# Patient Record
Sex: Male | Born: 1985 | Race: Black or African American | Hispanic: No | Marital: Single | Smoking: Current every day smoker
Health system: Southern US, Community
[De-identification: ages and names within clinical notes are randomized; demographics above are authoritative.]

## PROBLEM LIST (undated history)

## (undated) HISTORY — PX: OTHER SURGICAL HISTORY: SHX169

## (undated) HISTORY — PX: TYMPANOSTOMY TUBE PLACEMENT: SHX32

---

## 2015-06-22 ENCOUNTER — Emergency Department (HOSPITAL_COMMUNITY): Payer: Worker's Compensation

## 2015-06-22 ENCOUNTER — Encounter (HOSPITAL_COMMUNITY): Payer: Self-pay | Admitting: Emergency Medicine

## 2015-06-22 ENCOUNTER — Emergency Department (HOSPITAL_COMMUNITY)
Admission: EM | Admit: 2015-06-22 | Discharge: 2015-06-22 | Disposition: A | Discharge status: Home / Self Care | Payer: Worker's Compensation | Attending: Emergency Medicine | Admitting: Emergency Medicine

## 2015-06-22 DIAGNOSIS — S6010XA Contusion of unspecified finger with damage to nail, initial encounter: Secondary | ICD-10-CM

## 2015-06-22 DIAGNOSIS — Y929 Unspecified place or not applicable: Secondary | ICD-10-CM | POA: Insufficient documentation

## 2015-06-22 DIAGNOSIS — W208XXA Other cause of strike by thrown, projected or falling object, initial encounter: Secondary | ICD-10-CM | POA: Insufficient documentation

## 2015-06-22 DIAGNOSIS — Y990 Civilian activity done for income or pay: Secondary | ICD-10-CM | POA: Diagnosis not present

## 2015-06-22 DIAGNOSIS — F17210 Nicotine dependence, cigarettes, uncomplicated: Secondary | ICD-10-CM | POA: Insufficient documentation

## 2015-06-22 DIAGNOSIS — Y9389 Activity, other specified: Secondary | ICD-10-CM | POA: Insufficient documentation

## 2015-06-22 DIAGNOSIS — S60151A Contusion of right little finger with damage to nail, initial encounter: Secondary | ICD-10-CM | POA: Insufficient documentation

## 2015-06-22 DIAGNOSIS — S6991XA Unspecified injury of right wrist, hand and finger(s), initial encounter: Secondary | ICD-10-CM | POA: Diagnosis present

## 2015-06-22 MED ORDER — LIDOCAINE HCL 2 % IJ SOLN
10.0000 mL | Freq: Once | INTRAMUSCULAR | Status: AC
  Administered 2015-06-22: 200 mg
  Filled 2015-06-22: qty 20

## 2015-06-22 NOTE — ED Provider Notes (Signed)
CSN: [id number]     Arrival date & time 06/22/15  0051 History   First MD Initiated Contact with Patient 06/22/15 0122     Chief Complaint  Patient presents with  . Finger Injury    Right pinky    (Consider location/radiation/quality/duration/timing/severity/associated sxs/prior Treatment) The history is provided by the patient and medical records. No language interpreter was used.    Jonathan Wong is a 30 y.o. male  with no pertinent PMH who presents to the Emergency Department complaining of right fifth finger injury. 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 Throbbing pain and mild swelling. Ibuprofen taken with little relief. Employer sent him to ER for further evaluation. Denies numbness or tingling. Pain is worse with movement. Tetanus up -o-date.    History reviewed. No pertinent past medical history. History reviewed. No pertinent past surgical history. History reviewed. No pertinent family history. Social History  Substance Use Topics  . Smoking status: Current Every Day Smoker    Types: Cigarettes  . Smokeless tobacco: Never Used  . Alcohol Use: No    Review of Systems  Musculoskeletal: Positive for arthralgias.  Skin: Positive for rash.      Allergies  Review of patient's allergies indicates no known allergies.  Home Medications   Prior to Admission medications   Not on File   BP 135/86 mmHg  Pulse 66  Temp(Src) 98.6 F (37 C) (Oral)  Resp 20  Ht 6' (1.829 m)  Wt 106.595 kg  BMI 31.86 kg/m2  SpO2 100% Physical Exam  Constitutional: He is oriented to person, place, and time. He appears well-developed and well-nourished.  Alert and in no acute distress  HENT:  Head: Normocephalic and atraumatic.  Cardiovascular: Normal rate, regular rhythm and normal heart sounds.   Pulmonary/Chest: Effort normal and breath sounds normal. No respiratory distress. He has no  wheezes. He has no rales.  Musculoskeletal:  Right hand with full ROM. TTP of 5th distal finger. + Subungual hematoma. 2+ radial pulse, sensation intact.  Neurological: He is alert and oriented to person, place, and time.  Skin: Skin is warm and dry.  Nursing note and vitals reviewed.   ED Course  .Nerve Block Date/Time: 06/22/2015 3:45 AM Performed by: Janyth ContesWARD, JAIME PILCHER Authorized by: Janyth ContesWARD, JAIME PILCHER Consent: Verbal consent obtained. Risks and benefits: risks, benefits and alternatives were discussed Consent given by: patient Indications: pain relief Body area: upper extremity Nerve: digital Laterality: right Preparation: Patient was prepped and draped in the usual sterile fashion. Needle gauge: 25 G Location technique: anatomical landmarks Local anesthetic: lidocaine 2% without epinephrine Outcome: pain improved Patient tolerance: Patient tolerated the procedure well with no immediate complications  Wound repair Date/Time: 06/22/2015 3:48 AM Performed by: Janyth ContesWARD, JAIME PILCHER Authorized by: Janyth ContesWARD, JAIME PILCHER Consent: Verbal consent obtained. Consent given by: patient Preparation: Patient was prepped and draped in the usual sterile fashion. Local anesthesia used: yes Anesthesia: nerve block Local anesthetic: lidocaine 2% without epinephrine Anesthetic total: 7 ml Patient tolerance: Patient tolerated the procedure well with no immediate complications Comments: Subungal hematoma drained with cautery.    (including critical care time) Labs Review Labs Reviewed - No data to display  Imaging Review Dg Finger Little Right  06/22/2015  CLINICAL DATA:  30 year old male with injury to the right fifth digit. EXAM: RIGHT LITTLE FINGER 2+V COMPARISON:  None. FINDINGS: There is no acute fracture or  dislocation. The bones are well mineralized. There is mild diffuse soft tissue swelling of the fifth digit. No radiopaque foreign object identified. IMPRESSION: No acute/ traumatic  osseous pathology. Electronically Signed   By: Elgie Collard M.D.   On: 06/22/2015 02:41   I have personally reviewed and evaluated these images and lab results as part of my medical decision-making.   EKG Interpretation None      MDM   Final diagnoses:  Subungual hematoma of digit of hand, initial encounter   Jonathan Wong presents to ED for right 5th finger pain after a heavy item landed on distal finger. Sent by employer. X-rays were obtained which were unremarkable. Subungual hematoma on exam - digital block performed as dictated above then hematoma drained with cautery. Patient tolerated procedure well. Ibuprofen as needed for pain. Home care instructions were given. PCP follow up recommended and all questions answered.     [facility] Ward, PA-C 06/22/15 1610  Shon Baton, MD 06/22/15 [phone]

## 2015-06-22 NOTE — ED Notes (Signed)
Patient complains of injured finger. Patient was at work lifting something and hand slip. Finger hit conveyor belt and cut finger. Patient was sent here by employer.

## 2015-06-22 NOTE — Discharge Instructions (Signed)
Keep area clean and dry. Ibuprofen as needed for pain. Return to ER for any new or worsening symptoms, any additional concerns.

## 2015-10-18 ENCOUNTER — Emergency Department (HOSPITAL_BASED_OUTPATIENT_CLINIC_OR_DEPARTMENT_OTHER)
Admission: EM | Admit: 2015-10-18 | Discharge: 2015-10-18 | Disposition: A | Discharge status: Home / Self Care | Payer: Self-pay | Attending: Emergency Medicine | Admitting: Emergency Medicine

## 2015-10-18 ENCOUNTER — Emergency Department (HOSPITAL_BASED_OUTPATIENT_CLINIC_OR_DEPARTMENT_OTHER): Payer: Self-pay

## 2015-10-18 ENCOUNTER — Encounter (HOSPITAL_BASED_OUTPATIENT_CLINIC_OR_DEPARTMENT_OTHER): Payer: Self-pay

## 2015-10-18 DIAGNOSIS — J069 Acute upper respiratory infection, unspecified: Secondary | ICD-10-CM | POA: Insufficient documentation

## 2015-10-18 DIAGNOSIS — B9789 Other viral agents as the cause of diseases classified elsewhere: Secondary | ICD-10-CM

## 2015-10-18 DIAGNOSIS — F17210 Nicotine dependence, cigarettes, uncomplicated: Secondary | ICD-10-CM | POA: Insufficient documentation

## 2015-10-18 MED ORDER — OXYMETAZOLINE HCL 0.05 % NA SOLN
1.0000 | Freq: Once | NASAL | Status: AC
  Administered 2015-10-18: 1 via NASAL
  Filled 2015-10-18: qty 15

## 2015-10-18 MED ORDER — ALBUTEROL SULFATE HFA 108 (90 BASE) MCG/ACT IN AERS
2.0000 | INHALATION_SPRAY | RESPIRATORY_TRACT | Status: DC | PRN
  Administered 2015-10-18: 2 via RESPIRATORY_TRACT
  Filled 2015-10-18: qty 6.7

## 2015-10-18 NOTE — ED Provider Notes (Signed)
[facility]-EMERGENCY DEPT Provider Note   CSN: [id number] Arrival date & time: 10/18/15  1244     History   Chief Complaint Chief Complaint  Patient presents with  . Cough    HPI Jonathan Wong is a 30 y.o. male.  HPI  Pt presenting with c/o cough, body aches, fever and fatigue.  He states symptoms began 3 days ago.  He has not had any treatment prior to arrival.  Pt states he was sick with similar illness approx 2 weeks ago and seemed to improved, but then his symptoms recurred several days ago. No sore throat except with coughing.  No vomiting or diarrhea.  He has not had a good appetite but has been drinking fluids.  No rash.  No sick contacts.  Did not receive his flu shot this year.  No recent travel.  There are no other associated systemic symptoms, there are no other alleviating or modifying factors.   History reviewed. No pertinent past medical history.  There are no active problems to display for this patient.   History reviewed. No pertinent surgical history.     Home Medications    Prior to Admission medications   Not on File    Family History No family history on file.  Social History Social History  Substance Use Topics  . Smoking status: Current Every Day Smoker    Types: Cigarettes  . Smokeless tobacco: Never Used  . Alcohol use No     Allergies   Review of patient's allergies indicates no known allergies.   Review of Systems Review of Systems  ROS reviewed and all otherwise negative except for mentioned in HPI   Physical Exam Updated Vital Signs BP 134/68 (BP Location: Left Arm)   Pulse 89   Temp 98.1 F (36.7 C) (Oral)   Resp 16   Ht 6' (1.829 m)   Wt 114.8 kg   SpO2 95%   BMI 34.31 kg/m  Vitals reviewed Physical Exam  Physical Examination: General appearance - alert, well appearing, and in no distress Mental status - alert, oriented to person, place, and time Eyes - no conjunctival injection no scleral icterus Mouth - mucous  membranes moist, pharynx normal without lesions Chest - clear to auscultation with the exception of faint expiratory wheezes at bases, no rales or rhonchi, symmetric air entry, normal respiratory effort Heart - normal rate, regular rhythm, normal S1, S2, no murmurs, rubs, clicks or gallops Abdomen - soft, nontender, nondistended, no masses or organomegaly Neurological - alert, oriented, normal speech Extremities - peripheral pulses normal, no pedal edema, no clubbing or cyanosis Skin - normal coloration and turgor, no rashes   ED Treatments / Results  Labs (all labs ordered are listed, but only abnormal results are displayed) Labs Reviewed - No data to display  EKG  EKG Interpretation None       Radiology Dg Chest 2 View  Result Date: 10/18/2015 CLINICAL DATA:  30 year old male with a history of cough EXAM: CHEST  2 VIEW COMPARISON:  None. FINDINGS: The heart size and mediastinal contours are within normal limits. Both lungs are clear. The visualized skeletal structures are unremarkable. IMPRESSION: No radiographic evidence of acute cardiopulmonary disease. Signed, Yvone NeuJaime S. Loreta AveWagner, DO [facility] Electronically Signed   By: Gilmer MorJaime  Wagner D.O.   On: 10/18/2015 13:23    Procedures Procedures (including critical care time)  Medications Ordered in ED Medications  oxymetazoline (AFRIN) 0.05 % nasal spray 1 spray (1 spray Each  Nare Given 10/18/15 1353)     Initial Impression / Assessment and Plan / ED Course  I have reviewed the triage vital signs and the nursing notes.  Pertinent labs & imaging results that were available during my care of the patient were reviewed by me and considered in my medical decision making (see chart for details).  Clinical Course    Pt presenting with cough, subjective fever, body aches- he states he had similar symptoms 2 weeks ago that recurred.  For this reason CXR obtained as well as mild  wheezing on exam- CXR was reassuring.  Pt treated with albuterol and afrin for symptoms.  Suspect viral source of illness.   Patient is overall nontoxic and well hydrated in appearance.  Discharged with strict return precautions.  Pt agreeable with plan.   Final Clinical Impressions(s) / ED Diagnoses   Final diagnoses:  Viral URI with cough    New Prescriptions There are no discharge medications for this patient.    Jerelyn Scott, MD 10/19/15 [phone]

## 2015-10-18 NOTE — ED Triage Notes (Signed)
C/o prod cough, body aches since Sunday-NAD-steady gait

## 2015-10-18 NOTE — ED Notes (Signed)
MD at bedside. 

## 2015-10-18 NOTE — Discharge Instructions (Signed)
Return to the ED with any concerns including difficulty breathing despite using albuterol every 4 hours, not drinking fluids, decreased urine output, vomiting and not able to keep down liquids or medications, decreased level of alertness/lethargy, or any other alarming symptoms   You can use the nasal spray 2 sprays in each nostril twice daily for no more than 3 days

## 2016-05-12 ENCOUNTER — Emergency Department (HOSPITAL_BASED_OUTPATIENT_CLINIC_OR_DEPARTMENT_OTHER)
Admission: EM | Admit: 2016-05-12 | Discharge: 2016-05-12 | Disposition: A | Discharge status: Home / Self Care | Payer: Self-pay | Attending: Emergency Medicine | Admitting: Emergency Medicine

## 2016-05-12 ENCOUNTER — Encounter (HOSPITAL_BASED_OUTPATIENT_CLINIC_OR_DEPARTMENT_OTHER): Payer: Self-pay | Admitting: Emergency Medicine

## 2016-05-12 DIAGNOSIS — F17210 Nicotine dependence, cigarettes, uncomplicated: Secondary | ICD-10-CM | POA: Insufficient documentation

## 2016-05-12 DIAGNOSIS — R109 Unspecified abdominal pain: Secondary | ICD-10-CM | POA: Insufficient documentation

## 2016-05-12 DIAGNOSIS — M62830 Muscle spasm of back: Secondary | ICD-10-CM | POA: Insufficient documentation

## 2016-05-12 DIAGNOSIS — H53149 Visual discomfort, unspecified: Secondary | ICD-10-CM | POA: Insufficient documentation

## 2016-05-12 DIAGNOSIS — R509 Fever, unspecified: Secondary | ICD-10-CM | POA: Insufficient documentation

## 2016-05-12 DIAGNOSIS — M25511 Pain in right shoulder: Secondary | ICD-10-CM

## 2016-05-12 LAB — URINALYSIS, ROUTINE W REFLEX MICROSCOPIC
Glucose, UA: NEGATIVE mg/dL
Hgb urine dipstick: NEGATIVE
Ketones, ur: 15 mg/dL — AB
Leukocytes, UA: NEGATIVE
Nitrite: NEGATIVE
Protein, ur: NEGATIVE mg/dL
Specific Gravity, Urine: 1.027 (ref 1.005–1.030)
pH: 5.5 (ref 5.0–8.0)

## 2016-05-12 MED ORDER — IBUPROFEN 800 MG PO TABS
800.0000 mg | ORAL_TABLET | Freq: Once | ORAL | Status: AC
Start: 2016-05-12 — End: 2016-05-12
  Administered 2016-05-12: 800 mg via ORAL
  Filled 2016-05-12: qty 1

## 2016-05-12 MED ORDER — CYCLOBENZAPRINE HCL 10 MG PO TABS: 10.0000 mg | ORAL_TABLET | Freq: Three times a day (TID) | ORAL | 0 refills | Status: AC | PRN
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
## 2016-05-12 NOTE — ED Notes (Signed)
ED Provider at bedside. 
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
## 2016-05-12 NOTE — ED Notes (Signed)
Pt teaching provided on medications that may cause drowsiness. Pt instructed not to drive or operate heavy machinery while taking the prescribed medication. Pt verbalized understanding.   
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
## 2016-05-12 NOTE — Discharge Instructions (Signed)

## 2017-10-24 ENCOUNTER — Encounter (HOSPITAL_BASED_OUTPATIENT_CLINIC_OR_DEPARTMENT_OTHER): Payer: Self-pay | Admitting: *Deleted

## 2017-10-24 ENCOUNTER — Other Ambulatory Visit: Payer: Self-pay

## 2017-10-24 ENCOUNTER — Emergency Department (HOSPITAL_BASED_OUTPATIENT_CLINIC_OR_DEPARTMENT_OTHER)
Admission: EM | Admit: 2017-10-24 | Discharge: 2017-10-24 | Disposition: A | Discharge status: Home / Self Care | Payer: Self-pay | Attending: Emergency Medicine | Admitting: Emergency Medicine

## 2017-10-24 DIAGNOSIS — R531 Weakness: Secondary | ICD-10-CM | POA: Insufficient documentation

## 2017-10-24 DIAGNOSIS — F17210 Nicotine dependence, cigarettes, uncomplicated: Secondary | ICD-10-CM | POA: Insufficient documentation

## 2017-10-24 DIAGNOSIS — M25531 Pain in right wrist: Secondary | ICD-10-CM | POA: Insufficient documentation

## 2017-10-24 MED ORDER — IBUPROFEN 600 MG PO TABS: 600.0000 mg | ORAL_TABLET | Freq: Four times a day (QID) | ORAL | 0 refills | Status: DC | PRN

## 2017-10-24 MED ORDER — CYCLOBENZAPRINE HCL 10 MG PO TABS: 10.0000 mg | ORAL_TABLET | Freq: Two times a day (BID) | ORAL | 0 refills | Status: DC | PRN

## 2017-10-24 MED ORDER — PREDNISONE 20 MG PO TABS: ORAL_TABLET | ORAL | 0 refills | Status: DC

## 2017-10-24 NOTE — ED Provider Notes (Signed)
[facility] EMERGENCY DEPARTMENT Provider Note   CSN: [id number] Arrival date & time: 10/24/17  1941     History   Chief Complaint Chief Complaint  Patient presents with  . Wrist Pain    HPI Jonathan Wong is a 32 y.o. male.  The history is provided by the patient. No language interpreter was used.  Wrist Pain      32 year old male presenting complaining of right wrist pain.  For the past 3 to 4 days, patient has had pain involving his right wrist and right palm of hand.  He described pain as a sharp throbbing sensation increasing pain when he tries to make a fist or when he moves his fingers apart.  Symptom has been persistent with some weakness.  He felt as if this is nerve pain.  He denies any cyst significant injury.  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  No associated fever or chills, chest pain or arm pain.  No specific treatment tried at home.  History reviewed. No pertinent past medical history.  There are no active problems to display for this patient.   Past Surgical History:  Procedure Laterality Date  . TYMPANOSTOMY TUBE PLACEMENT          Home Medications    Prior to Admission medications   Not on File    Family History History reviewed. No pertinent family history.  Social History Social History   Tobacco Use  . Smoking status: Current Every Day Smoker    Packs/day: 1.00    Types: Cigarettes  . Smokeless tobacco: Never Used  Substance Use Topics  . Alcohol use: No  . Drug use: No     Allergies   Patient has no known allergies.   Review of Systems Review of Systems  Constitutional: Negative for fever.  Musculoskeletal: Positive for myalgias.  Skin: Negative for wound.  Neurological: Negative for numbness.     Physical Exam Updated Vital Signs BP (!) 129/97 (BP Location: Left Arm)   Pulse 77   Temp 98.5 F (36.9 C) (Oral)   Resp 18   Ht 6' (1.829 m)   Wt 127 kg   SpO2 99%   BMI 37.97 kg/m   Physical Exam    Constitutional: He appears well-developed and well-nourished. No distress.  HENT:  Head: Atraumatic.  Eyes: Conjunctivae are normal.  Neck: Neck supple.  Cardiovascular: Intact distal pulses.  Musculoskeletal: He exhibits tenderness (Left wrist and hand: Mild diffuse tenderness throughout the wrist and palms of pain on palpation.  Normal wrist flexion extension supination and pronation.  Normal finger range of motion throughout all fingers.  Decreased grip strength, brisk cap refill ).  Neurological: He is alert.  Skin: No rash noted.  Psychiatric: He has a normal mood and affect.  Nursing note and vitals reviewed.    ED Treatments / Results  Labs (all labs ordered are listed, but only abnormal results are displayed) Labs Reviewed - No data to display  EKG None  Radiology No results found.  Procedures Procedures (including critical care time)  Medications Ordered in ED Medications - No data to display   Initial Impression / Assessment and Plan / ED Course  I have reviewed the triage vital signs and the nursing notes.  Pertinent labs & imaging results that were available during my care of the patient were reviewed by me and considered in my medical decision making (see chart for details).     BP (!) 129/97 (BP Location: Left Arm)  Pulse 77   Temp 98.5 F (36.9 C) (Oral)   Resp 18   Ht 6' (1.829 m)   Wt 127 kg   SpO2 99%   BMI 37.97 kg/m    Final Clinical Impressions(s) / ED Diagnoses   Final diagnoses:  Right wrist pain    ED Discharge Orders         Ordered    predniSONE (DELTASONE) 20 MG tablet     10/24/17 2209    ibuprofen (ADVIL,MOTRIN) 600 MG tablet  Every 6 hours PRN     10/24/17 2209    cyclobenzaprine (FLEXERIL) 10 MG tablet  2 times daily PRN     10/24/17 2209         10:06 PM Patient here with pain to his right wrist and hand and having trouble making a fist having trouble with grip strength.  It is seems to be associate his weakness  is likely pain related.  No specific injury concerning for fracture or dislocation.  He is neurovascularly intact.  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  Suspect carpal tunnel syndrome causing wrist pain.  Wrist brace provided for support, a short course of steroid and anti-inflammatory medication along with muscle relaxant prescribed.  Hand specialist referral given as needed.   Fayrene Helper, PA-C 10/24/17 2209    Tilden Fossa, MD 10/25/17 [phone]

## 2017-10-24 NOTE — ED Triage Notes (Signed)
Pt /o right wrist pain w/o injury x 1 week

## 2018-01-05 ENCOUNTER — Other Ambulatory Visit: Payer: Self-pay

## 2018-01-05 ENCOUNTER — Encounter (HOSPITAL_BASED_OUTPATIENT_CLINIC_OR_DEPARTMENT_OTHER): Payer: Self-pay

## 2018-01-05 ENCOUNTER — Emergency Department (HOSPITAL_BASED_OUTPATIENT_CLINIC_OR_DEPARTMENT_OTHER)
Admission: EM | Admit: 2018-01-05 | Discharge: 2018-01-06 | Disposition: A | Discharge status: Home / Self Care | Payer: Self-pay | Attending: Emergency Medicine | Admitting: Emergency Medicine

## 2018-01-05 DIAGNOSIS — Z113 Encounter for screening for infections with a predominantly sexual mode of transmission: Secondary | ICD-10-CM | POA: Insufficient documentation

## 2018-01-05 DIAGNOSIS — J111 Influenza due to unidentified influenza virus with other respiratory manifestations: Secondary | ICD-10-CM

## 2018-01-05 DIAGNOSIS — R369 Urethral discharge, unspecified: Secondary | ICD-10-CM | POA: Insufficient documentation

## 2018-01-05 DIAGNOSIS — R079 Chest pain, unspecified: Secondary | ICD-10-CM | POA: Insufficient documentation

## 2018-01-05 DIAGNOSIS — F17210 Nicotine dependence, cigarettes, uncomplicated: Secondary | ICD-10-CM | POA: Insufficient documentation

## 2018-01-05 DIAGNOSIS — R69 Illness, unspecified: Secondary | ICD-10-CM

## 2018-01-05 DIAGNOSIS — Z87438 Personal history of other diseases of male genital organs: Secondary | ICD-10-CM

## 2018-01-05 LAB — URINALYSIS, ROUTINE W REFLEX MICROSCOPIC
Bilirubin Urine: NEGATIVE
GLUCOSE, UA: NEGATIVE mg/dL
Hgb urine dipstick: NEGATIVE
Ketones, ur: NEGATIVE mg/dL
LEUKOCYTES UA: NEGATIVE
Nitrite: NEGATIVE
PROTEIN: 30 mg/dL — AB
Specific Gravity, Urine: >1.03 — ABNORMAL HIGH (ref 1.005–1.030)
pH: 6 (ref 5.0–8.0)

## 2018-01-05 LAB — URINALYSIS, MICROSCOPIC (REFLEX): RBC / HPF: NONE SEEN RBC/hpf (ref 0–5)

## 2018-01-05 MED ORDER — LIDOCAINE HCL (PF) 1 % IJ SOLN
INTRAMUSCULAR | Status: AC
  Administered 2018-01-05: 23:00:00
  Filled 2018-01-05: qty 5

## 2018-01-05 MED ORDER — CEFTRIAXONE SODIUM 250 MG IJ SOLR
250.0000 mg | Freq: Once | INTRAMUSCULAR | Status: AC
  Administered 2018-01-05: 250 mg via INTRAMUSCULAR
  Filled 2018-01-05: qty 250

## 2018-01-05 MED ORDER — SODIUM CHLORIDE 0.9 % IV BOLUS
1000.0000 mL | Freq: Once | INTRAVENOUS | Status: AC
  Administered 2018-01-05: 1000 mL via INTRAVENOUS

## 2018-01-05 MED ORDER — AZITHROMYCIN 250 MG PO TABS
1000.0000 mg | ORAL_TABLET | Freq: Once | ORAL | Status: AC
  Administered 2018-01-05: 1000 mg via ORAL
  Filled 2018-01-05: qty 4

## 2018-01-05 MED ORDER — ACETAMINOPHEN 325 MG PO TABS
650.0000 mg | ORAL_TABLET | Freq: Once | ORAL | Status: AC | PRN
  Administered 2018-01-05: 650 mg via ORAL
  Filled 2018-01-05: qty 2

## 2018-01-05 NOTE — ED Notes (Signed)
Pt began having CP, was extremely diaphoretic, and feeling lightheaded. Pt moved to triage room to obtain EKG. Pt's CP is reproducible to palpation. Pt denies ShOB and nausea.

## 2018-01-05 NOTE — ED Provider Notes (Signed)
[facility] EMERGENCY DEPARTMENT Provider Note   CSN: [id number] Arrival date & time: 01/05/18  1945     History   Chief Complaint Chief Complaint  Patient presents with  . Cough  . Penile Discharge    HPI Jonathan Wong is a 32 y.o. male.  The history is provided by the patient.  URI   This is a new problem. The current episode started 6 to 12 hours ago. The problem has been gradually worsening. The maximum temperature recorded prior to his arrival was 102 to 102.9 F. The fever has been present for less than 1 day. Associated symptoms include congestion, headaches, rhinorrhea and cough. Pertinent negatives include no chest pain and no abdominal pain. Associated symptoms comments: Myalgias, fatigue, lightheaded. He has tried nothing for the symptoms. The treatment provided no relief.  Penile Discharge  This is a new problem. Episode onset: 2 weeks ago, 1 episode. The problem occurs rarely (only 1 time 2 weeks ago and no other episodes). The problem has been resolved. Associated symptoms include headaches. Pertinent negatives include no chest pain and no abdominal pain. Associated symptoms comments: No lesions on the penis, testicular pain or swelling.  No dysuria, frequency or urgency and no discharge now.. Nothing aggravates the symptoms. Nothing relieves the symptoms. He has tried nothing for the symptoms. The treatment provided significant relief.    History reviewed. No pertinent past medical history.  There are no active problems to display for this patient.   Past Surgical History:  Procedure Laterality Date  . TYMPANOSTOMY TUBE PLACEMENT          Home Medications    Prior to Admission medications   Medication Sig Start Date End Date Taking? Authorizing Provider  cyclobenzaprine (FLEXERIL) 10 MG tablet Take 1 tablet (10 mg total) by mouth 2 (two) times daily as needed for muscle spasms. 10/24/17   Fayrene Helperran, [place], PA-C  ibuprofen (ADVIL,MOTRIN) 600 MG tablet  Take 1 tablet (600 mg total) by mouth every 6 (six) hours as needed. 10/24/17   Fayrene Helperran, [place], PA-C  predniSONE (DELTASONE) 20 MG tablet 3 tabs po day one, then 2 tabs daily x 4 days 10/24/17   Fayrene Helperran, [place], PA-C    Family History No family history on file.  Social History Social History   Tobacco Use  . Smoking status: Current Every Day Smoker    Packs/day: 1.00    Types: Cigarettes  . Smokeless tobacco: Never Used  Substance Use Topics  . Alcohol use: No  . Drug use: No     Allergies   Patient has no known allergies.   Review of Systems Review of Systems  HENT: Positive for congestion and rhinorrhea.   Respiratory: Positive for cough.   Cardiovascular: Negative for chest pain.  Gastrointestinal: Negative for abdominal pain.  Genitourinary: Positive for discharge.  Neurological: Positive for headaches.  All other systems reviewed and are negative.    Physical Exam Updated Vital Signs BP 112/64   Pulse 86   Temp (!) 102.7 F (39.3 C) (Oral)   Resp 19   Wt 118.1 kg   SpO2 95%   BMI 35.32 kg/m   Physical Exam Vitals signs and nursing note reviewed.  Constitutional:      General: He is not in acute distress.    Appearance: He is well-developed.  HENT:     Head: Normocephalic and atraumatic.     Right Ear: Tympanic membrane normal.     Left Ear: Tympanic membrane normal.  Nose: Congestion present.     Mouth/Throat:     Mouth: Mucous membranes are dry.  Eyes:     Conjunctiva/sclera: Conjunctivae normal.     Pupils: Pupils are equal, round, and reactive to light.  Neck:     Musculoskeletal: Normal range of motion and neck supple.  Cardiovascular:     Rate and Rhythm: Normal rate and regular rhythm.     Heart sounds: No murmur.  Pulmonary:     Effort: Pulmonary effort is normal. No respiratory distress.     Breath sounds: Normal breath sounds. No wheezing or rales.  Abdominal:     General: There is no distension.     Palpations: Abdomen is soft.      Tenderness: There is no abdominal tenderness. There is no guarding or rebound.  Musculoskeletal: Normal range of motion.        General: No tenderness.  Skin:    General: Skin is warm and dry.     Findings: No erythema or rash.     Comments: Hot to touch  Neurological:     Mental Status: He is alert and oriented to person, place, and time.  Psychiatric:        Behavior: Behavior normal.      ED Treatments / Results  Labs (all labs ordered are listed, but only abnormal results are displayed) Labs Reviewed  URINALYSIS, ROUTINE W REFLEX MICROSCOPIC - Abnormal; Notable for the following components:      Result Value   Specific Gravity, Urine >1.030 (*)    Protein, ur 30 (*)    All other components within normal limits  URINALYSIS, MICROSCOPIC (REFLEX) - Abnormal; Notable for the following components:   Bacteria, UA FEW (*)    All other components within normal limits  GC/CHLAMYDIA PROBE AMP () NOT AT [facility]    EKG EKG Interpretation  Date/Time:  Monday January 05 2018 20:47:44 EST Ventricular Rate:  81 PR Interval:  148 QRS Duration: 82 QT Interval:  312 QTC Calculation: 362 R Axis:   84 Text Interpretation:  Normal sinus rhythm Normal ECG No previous tracing Confirmed by Gwyneth Sprout ([id number]) on 01/05/2018 10:14:04 PM   Radiology No results found.  Procedures Procedures (including critical care time)  Medications Ordered in ED Medications  sodium chloride 0.9 % bolus 1,000 mL (1,000 mLs Intravenous New Bag/Given 01/05/18 2259)  acetaminophen (TYLENOL) tablet 650 mg (650 mg Oral Given 01/05/18 2144)  cefTRIAXone (ROCEPHIN) injection 250 mg (250 mg Intramuscular Given 01/05/18 2251)  azithromycin (ZITHROMAX) tablet 1,000 mg (1,000 mg Oral Given 01/05/18 2250)  lidocaine (PF) (XYLOCAINE) 1 % injection (  Given 01/05/18 2251)     Initial Impression / Assessment and Plan / ED Course  I have reviewed the triage vital signs and the nursing  notes.  Pertinent labs & imaging results that were available during my care of the patient were reviewed by me and considered in my medical decision making (see chart for details).     Pt with symptoms consistent with influenza.  Normal exam here but is febrile.  No signs of breathing difficulty  No signs of strep pharyngitis, otitis or abnormal abdominal findings.  Pt treated with tamiflu. Secondly pt with 1 episode of d/c 2 weeks ago and no more wants STD check and treatment.  uA with dehydration but o/w wnl.  Gc/chlamydia pending. Will continue antipyretica and rest and fluids and return for any further problems.   Final Clinical Impressions(s) / ED Diagnoses  Final diagnoses:  Influenza-like illness  History of penile discharge    ED Discharge Orders    None       Gwyneth SproutPlunkett, Jermaine Neuharth, MD 01/05/18 2325

## 2018-01-05 NOTE — ED Triage Notes (Signed)
C/o flu sx day 1-also requesting STD check with penile d/c 2 weeks ago-NAD-steady gait

## 2018-01-06 MED ORDER — IBUPROFEN 400 MG PO TABS
600.0000 mg | ORAL_TABLET | Freq: Once | ORAL | Status: AC
  Administered 2018-01-06: 600 mg via ORAL
  Filled 2018-01-06: qty 1

## 2018-01-06 MED ORDER — OSELTAMIVIR PHOSPHATE 75 MG PO CAPS: 75.0000 mg | ORAL_CAPSULE | Freq: Two times a day (BID) | ORAL | 0 refills | Status: DC

## 2018-01-06 NOTE — Discharge Instructions (Signed)
Make sure you are using tylenol and ibuprofen for fever control.  Drink plenty of fluids and rest.

## 2018-01-08 LAB — GC/CHLAMYDIA PROBE AMP (~~LOC~~) NOT AT ARMC
CHLAMYDIA, DNA PROBE: NEGATIVE
Neisseria Gonorrhea: NEGATIVE

## 2018-04-02 ENCOUNTER — Emergency Department (HOSPITAL_BASED_OUTPATIENT_CLINIC_OR_DEPARTMENT_OTHER)
Admission: EM | Admit: 2018-04-02 | Discharge: 2018-04-03 | Disposition: A | Discharge status: Home / Self Care | Payer: Self-pay | Attending: Emergency Medicine | Admitting: Emergency Medicine

## 2018-04-02 ENCOUNTER — Encounter (HOSPITAL_BASED_OUTPATIENT_CLINIC_OR_DEPARTMENT_OTHER): Payer: Self-pay | Admitting: *Deleted

## 2018-04-02 ENCOUNTER — Emergency Department (HOSPITAL_BASED_OUTPATIENT_CLINIC_OR_DEPARTMENT_OTHER): Payer: Self-pay

## 2018-04-02 ENCOUNTER — Other Ambulatory Visit: Payer: Self-pay

## 2018-04-02 DIAGNOSIS — F17210 Nicotine dependence, cigarettes, uncomplicated: Secondary | ICD-10-CM | POA: Insufficient documentation

## 2018-04-02 DIAGNOSIS — T161XXA Foreign body in right ear, initial encounter: Secondary | ICD-10-CM | POA: Insufficient documentation

## 2018-04-02 DIAGNOSIS — Y939 Activity, unspecified: Secondary | ICD-10-CM | POA: Insufficient documentation

## 2018-04-02 DIAGNOSIS — H6121 Impacted cerumen, right ear: Secondary | ICD-10-CM | POA: Insufficient documentation

## 2018-04-02 DIAGNOSIS — R55 Syncope and collapse: Secondary | ICD-10-CM | POA: Insufficient documentation

## 2018-04-02 DIAGNOSIS — Z1889 Other specified retained foreign body fragments: Secondary | ICD-10-CM | POA: Insufficient documentation

## 2018-04-02 DIAGNOSIS — W19XXXD Unspecified fall, subsequent encounter: Secondary | ICD-10-CM | POA: Insufficient documentation

## 2018-04-02 DIAGNOSIS — Y929 Unspecified place or not applicable: Secondary | ICD-10-CM | POA: Insufficient documentation

## 2018-04-02 DIAGNOSIS — Y999 Unspecified external cause status: Secondary | ICD-10-CM | POA: Insufficient documentation

## 2018-04-02 DIAGNOSIS — M545 Low back pain, unspecified: Secondary | ICD-10-CM

## 2018-04-02 LAB — BASIC METABOLIC PANEL
ANION GAP: 5 (ref 5–15)
BUN: 20 mg/dL (ref 6–20)
CO2: 22 mmol/L (ref 22–32)
Calcium: 9.1 mg/dL (ref 8.9–10.3)
Chloride: 107 mmol/L (ref 98–111)
Creatinine, Ser: 1.33 mg/dL — ABNORMAL HIGH (ref 0.61–1.24)
GFR calc Af Amer: >60 mL/min (ref >60)
GFR calc non Af Amer: >60 mL/min (ref >60)
GLUCOSE: 101 mg/dL — AB (ref 70–99)
POTASSIUM: 3.7 mmol/L (ref 3.5–5.1)
Sodium: 134 mmol/L — ABNORMAL LOW (ref 135–145)

## 2018-04-02 NOTE — ED Provider Notes (Signed)
[facility] EMERGENCY DEPARTMENT Provider Note   CSN: [id number] Arrival date & time: 04/02/18  2155    History   Chief Complaint Chief Complaint  Patient presents with  . Ear Fullness    HPI Jonathan Wong is a 33 y.o. male.     Patient is a 33 year old male with no significant past medical history presenting with complaints of right ear fullness.  He feels as though he has a cerumen impaction.  He denies any pain, fevers, or chills.  He also reports "passing out spells" that have been occurring intermittently for the past several years.  He states that every so often develops dizziness followed by unconsciousness.  He had another episode recently and states that his back has been hurting since.  He describes discomfort in his low back with no radiation into the legs, bowel or bladder complaints, weakness, or numbness.  He has not seen a physician for this problem.  The history is provided by the patient.  Ear Fullness  This is a new problem. The current episode started 2 days ago. The problem occurs constantly. The problem has been gradually worsening. Nothing aggravates the symptoms. Nothing relieves the symptoms. He has tried nothing for the symptoms.    History reviewed. No pertinent past medical history.  There are no active problems to display for this patient.   Past Surgical History:  Procedure Laterality Date  . TYMPANOSTOMY TUBE PLACEMENT          Home Medications    Prior to Admission medications   Medication Sig Start Date End Date Taking? Authorizing Provider  cyclobenzaprine (FLEXERIL) 10 MG tablet Take 1 tablet (10 mg total) by mouth 2 (two) times daily as needed for muscle spasms. 10/24/17   Fayrene Helper, PA-C  ibuprofen (ADVIL,MOTRIN) 600 MG tablet Take 1 tablet (600 mg total) by mouth every 6 (six) hours as needed. 10/24/17   Fayrene Helper, PA-C  oseltamivir (TAMIFLU) 75 MG capsule Take 1 capsule (75 mg total) by mouth every 12 (twelve) hours.  01/06/18   Gwyneth Sprout, MD  predniSONE (DELTASONE) 20 MG tablet 3 tabs po day one, then 2 tabs daily x 4 days 10/24/17   Fayrene Helper, PA-C    Family History History reviewed. No pertinent family history.  Social History Social History   Tobacco Use  . Smoking status: Current Every Day Smoker    Packs/day: 1.00    Types: Cigarettes  . Smokeless tobacco: Never Used  Substance Use Topics  . Alcohol use: No  . Drug use: No     Allergies   Patient has no known allergies.   Review of Systems Review of Systems  All other systems reviewed and are negative.    Physical Exam Updated Vital Signs BP 119/81   Pulse 81   Temp (!) 97.5 F (36.4 C) (Oral)   Resp 20   Ht 6' (1.829 m)   Wt 108.9 kg   SpO2 97%   BMI 32.55 kg/m   Physical Exam Vitals signs and nursing note reviewed.  Constitutional:      General: He is not in acute distress.    Appearance: He is well-developed. He is not diaphoretic.  HENT:     Head: Normocephalic and atraumatic.     Right Ear: There is impacted cerumen.     Left Ear: Tympanic membrane and ear canal normal.  Neck:     Musculoskeletal: Normal range of motion and neck supple.  Cardiovascular:     Rate  and Rhythm: Normal rate and regular rhythm.     Heart sounds: No murmur. No friction rub.  Pulmonary:     Effort: Pulmonary effort is normal. No respiratory distress.     Breath sounds: Normal breath sounds. No wheezing or rales.  Abdominal:     General: Bowel sounds are normal. There is no distension.     Palpations: Abdomen is soft.     Tenderness: There is no abdominal tenderness.  Musculoskeletal: Normal range of motion.  Skin:    General: Skin is warm and dry.  Neurological:     General: No focal deficit present.     Mental Status: He is alert and oriented to person, place, and time.     Cranial Nerves: No cranial nerve deficit.     Motor: No weakness.     Coordination: Coordination normal.      ED Treatments / Results   Labs (all labs ordered are listed, but only abnormal results are displayed) Labs Reviewed  BASIC METABOLIC PANEL  CBC WITH DIFFERENTIAL/PLATELET    EKG None  Radiology No results found.  Procedures Procedures (including critical care time)  Medications Ordered in ED Medications - No data to display   Initial Impression / Assessment and Plan / ED Course  I have reviewed the triage vital signs and the nursing notes.  Pertinent labs & imaging results that were available during my care of the patient were reviewed by me and considered in my medical decision making (see chart for details).  Patient presents with complaints of fullness in his right ear.  Patient had what appeared to be a cerumen impaction.  This was irrigated with saline and peroxide and the tip of a Q-tip came out.  The TM is clear and ear canal is now clear as well.  Patient also complains of episodes of syncope that have been occurring for several years.  He had an episode a few weeks ago that caused him to fall and hurt his back.  The x-rays of his back are unremarkable and he is neurologically intact.  His laboratory studies show no obvious cause for his syncope and EKG shows only early repolarization.  I am uncertain as to the etiology of this syncope, however if this persists, the patient may want to follow-up with his primary doctor to discuss referral for a Holter monitor.  Final Clinical Impressions(s) / ED Diagnoses   Final diagnoses:  None    ED Discharge Orders    None       Geoffery Lyons, MD 04/03/18 [phone]

## 2018-04-02 NOTE — ED Triage Notes (Signed)
His ears feel "plugged".

## 2018-04-03 LAB — CBC WITH DIFFERENTIAL/PLATELET
ABS IMMATURE GRANULOCYTES: 0.03 10*3/uL (ref 0.00–0.07)
Basophils Absolute: 0 10*3/uL (ref 0.0–0.1)
Basophils Relative: 0 %
Eosinophils Absolute: 0.2 10*3/uL (ref 0.0–0.5)
Eosinophils Relative: 3 %
HEMATOCRIT: 45.6 % (ref 39.0–52.0)
HEMOGLOBIN: 14.3 g/dL (ref 13.0–17.0)
IMMATURE GRANULOCYTES: 0 %
LYMPHS ABS: 3.1 10*3/uL (ref 0.7–4.0)
LYMPHS PCT: 38 %
MCH: 26 pg (ref 26.0–34.0)
MCHC: 31.4 g/dL (ref 30.0–36.0)
MCV: 82.9 fL (ref 80.0–100.0)
MONO ABS: 0.5 10*3/uL (ref 0.1–1.0)
MONOS PCT: 6 %
NEUTROS ABS: 4.3 10*3/uL (ref 1.7–7.7)
NEUTROS PCT: 53 %
PLATELETS: 138 10*3/uL — AB (ref 150–400)
RBC: 5.5 MIL/uL (ref 4.22–5.81)
RDW: 16.1 % — ABNORMAL HIGH (ref 11.5–15.5)
WBC: 8.1 10*3/uL (ref 4.0–10.5)
nRBC: 0 % (ref 0.0–0.2)

## 2018-04-03 NOTE — Discharge Instructions (Addendum)
Ibuprofen 600 mg every 6 hours as needed for pain.  If your episodes of passing out persist, follow-up with a primary doctor to discuss a Holter monitor.

## 2018-07-03 ENCOUNTER — Other Ambulatory Visit: Payer: Self-pay

## 2018-07-03 ENCOUNTER — Encounter (HOSPITAL_BASED_OUTPATIENT_CLINIC_OR_DEPARTMENT_OTHER): Payer: Self-pay

## 2018-07-03 ENCOUNTER — Emergency Department (HOSPITAL_BASED_OUTPATIENT_CLINIC_OR_DEPARTMENT_OTHER): Payer: Self-pay

## 2018-07-03 ENCOUNTER — Emergency Department (HOSPITAL_BASED_OUTPATIENT_CLINIC_OR_DEPARTMENT_OTHER)
Admission: EM | Admit: 2018-07-03 | Discharge: 2018-07-03 | Disposition: A | Discharge status: Home / Self Care | Payer: Self-pay | Attending: Emergency Medicine | Admitting: Emergency Medicine

## 2018-07-03 DIAGNOSIS — S63501A Unspecified sprain of right wrist, initial encounter: Secondary | ICD-10-CM | POA: Insufficient documentation

## 2018-07-03 DIAGNOSIS — F17210 Nicotine dependence, cigarettes, uncomplicated: Secondary | ICD-10-CM | POA: Insufficient documentation

## 2018-07-03 DIAGNOSIS — Y9389 Activity, other specified: Secondary | ICD-10-CM | POA: Insufficient documentation

## 2018-07-03 DIAGNOSIS — M5432 Sciatica, left side: Secondary | ICD-10-CM | POA: Insufficient documentation

## 2018-07-03 DIAGNOSIS — Y929 Unspecified place or not applicable: Secondary | ICD-10-CM | POA: Insufficient documentation

## 2018-07-03 DIAGNOSIS — Y999 Unspecified external cause status: Secondary | ICD-10-CM | POA: Insufficient documentation

## 2018-07-03 LAB — CBG MONITORING, ED: Glucose-Capillary: 111 mg/dL — ABNORMAL HIGH (ref 70–99)

## 2018-07-03 MED ORDER — DEXAMETHASONE 4 MG PO TABS
8.0000 mg | ORAL_TABLET | Freq: Once | ORAL | Status: AC
  Administered 2018-07-03: 22:00:00 8 mg via ORAL
  Filled 2018-07-03: qty 2

## 2018-07-03 MED ORDER — ACETAMINOPHEN 325 MG PO TABS
650.0000 mg | ORAL_TABLET | Freq: Once | ORAL | Status: AC
  Administered 2018-07-03: 650 mg via ORAL
  Filled 2018-07-03: qty 2
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
## 2018-07-03 NOTE — ED Notes (Signed)
Pt reports dx of sciatica a few months ago. Pt denies getting prescriptions. Reports xrays were negative. Pt reports pain has never gotten better. Pain is to left hip and down leg

## 2018-10-25 ENCOUNTER — Other Ambulatory Visit: Payer: Self-pay

## 2018-10-25 ENCOUNTER — Encounter (HOSPITAL_BASED_OUTPATIENT_CLINIC_OR_DEPARTMENT_OTHER): Payer: Self-pay | Admitting: *Deleted

## 2018-10-25 ENCOUNTER — Emergency Department (HOSPITAL_BASED_OUTPATIENT_CLINIC_OR_DEPARTMENT_OTHER)
Admission: EM | Admit: 2018-10-25 | Discharge: 2018-10-25 | Disposition: A | Discharge status: Home / Self Care | Payer: Self-pay | Attending: Emergency Medicine | Admitting: Emergency Medicine

## 2018-10-25 DIAGNOSIS — Z20828 Contact with and (suspected) exposure to other viral communicable diseases: Secondary | ICD-10-CM | POA: Insufficient documentation

## 2018-10-25 DIAGNOSIS — N5089 Other specified disorders of the male genital organs: Secondary | ICD-10-CM | POA: Insufficient documentation

## 2018-10-25 DIAGNOSIS — J069 Acute upper respiratory infection, unspecified: Secondary | ICD-10-CM | POA: Insufficient documentation

## 2018-10-25 DIAGNOSIS — F17210 Nicotine dependence, cigarettes, uncomplicated: Secondary | ICD-10-CM | POA: Insufficient documentation

## 2018-10-25 DIAGNOSIS — M545 Low back pain: Secondary | ICD-10-CM | POA: Insufficient documentation

## 2018-10-25 DIAGNOSIS — J029 Acute pharyngitis, unspecified: Secondary | ICD-10-CM | POA: Insufficient documentation

## 2018-10-25 DIAGNOSIS — R369 Urethral discharge, unspecified: Secondary | ICD-10-CM | POA: Insufficient documentation

## 2018-10-25 MED ORDER — LIDOCAINE VISCOUS HCL 2 % MT SOLN
15.0000 mL | Freq: Once | OROMUCOSAL | Status: AC
  Administered 2018-10-25: 12:00:00 15 mL via ORAL
  Filled 2018-10-25: qty 15

## 2018-10-25 MED ORDER — ALUM & MAG HYDROXIDE-SIMETH 200-200-20 MG/5ML PO SUSP
30.0000 mL | Freq: Once | ORAL | Status: AC
  Administered 2018-10-25: 30 mL via ORAL
  Filled 2018-10-25: qty 30

## 2018-10-25 NOTE — ED Notes (Signed)
Patient wants to be tested for STD, too.
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
## 2018-10-25 NOTE — ED Triage Notes (Signed)
Sciatic pain radiating to his left leg for 4 months, bilateral upper abdominal pain and feeling nauseous started last night.  Diarrhea 2 times for 24 hours, sneezing started last night. Coughing started this morning.  Denies fever.  Hi boss has Covid.

## 2018-10-25 NOTE — ED Notes (Signed)
Patient refused to take Maalox and Lidocaine.  He only took a small sip.

## 2018-10-25 NOTE — ED Notes (Signed)
ED Provider at bedside. 

## 2018-10-25 NOTE — Discharge Instructions (Addendum)
Continue DayQuil, NyQuil, and other OTC medications for symptomatic relief.  Ibuprofen and Tylenol as needed for fevers.  Over-the-counter antacids if he continues to experience burning epigastric discomfort.  Instructed patient to return to the ED or seek medical attention should they develop any fevers or chills unrelieved by Tylenol or ibuprofen, difficulty breathing, chest pain, uncontrollable nausea or vomiting, or any other new or worsening symptoms.  Free HIV and STD Testing These locations offer FREE confidential testing for HIV, Chlamydia, Gonorrhea, and Syphilis. Non-Traditional Testing Sites Address Telephone  [facility] [address] [place] [phone] Mondays 5pm - 7pm  [facility] [address] [place] [phone] Wednesdays 2pm-8pm  [employer] [facility] [address], [place] [phone] Thursdays 9am-12noon 1pm-4pm  [facility] [address] [place] [phone] Tuesdays Thursdays 9am-12noon 1pm-4pm  [facility] [employer] offers free, confidential testing and treatment for HIV, Chlamydia, Gonorrhea, Syphilis, Herpes, Bacterial Vaginosis, Yeast, and Trichomoniasis. Traditional Testing   [facility] [facility] [address], [place] [phone]  Monday thru Friday  Call for an appointment  [facility] [address] [place] [phone] Monday thru Friday  Call for anappointment.  If you have any questions about this information please call [phone]. 11/22/2010   If you live with, or provide care at home for, a person confirmed to have, or being evaluated for, COVID-19 infection please follow these guidelines to prevent infection:  Follow healthcare  providers instructions Make sure that you understand and can help the patient follow any healthcare provider instructions for all care.  Provide for the patients basic needs You should help the patient with basic needs in the home and provide support for getting groceries, prescriptions, and other personal needs.  Monitor the patients symptoms If they are getting sicker, call his or her medical provider a  This will help the healthcare providers office take steps to keep other people from getting infected. Ask the healthcare provider to call the local or state health department.  Limit the number of people who have contact with the patient If possible, have only one caregiver for the patient. Other household members should stay in another home or place of residence. If this is not possible, they should stay in another room, or be separated from the patient as much as possible. Use a separate bathroom, if available. Restrict visitors who do not have an essential need to be in the home.  Keep older adults, very young children, and other sick people away from the patient Keep older adults, very young children, and those who have compromised immune systems or chronic health conditions away from the patient. This includes people with chronic heart, lung, or kidney conditions, diabetes, and cancer.  Ensure good ventilation Make sure that shared spaces in the home have good air flow, such as from an air conditioner or an opened window, weather permitting.  Wash your hands often Wash your hands often and thoroughly with soap and water for at least 20 seconds. You can use an alcohol based hand sanitizer if soap and water are not available and if your hands are not visibly dirty. Avoid touching your eyes, nose, and mouth with unwashed hands. Use disposable paper towels to dry your hands. If not available, use dedicated cloth towels and replace  them when they become wet.  Wear a facemask and  gloves Wear a disposable facemask at all times in the room and gloves when you touch or have contact with the patients blood, body fluids, and/or secretions or excretions, such as sweat, saliva, sputum, nasal mucus, vomit, urine, or feces.  Ensure the mask fits over your nose and mouth tightly, and do not touch it during use. Throw out disposable facemasks and gloves after using them. Do not reuse. Wash your hands immediately after removing your facemask and gloves. If your personal clothing becomes contaminated, carefully remove clothing and launder. Wash your hands after handling contaminated clothing. Place all used disposable facemasks, gloves, and other waste in a lined container before disposing them with other household waste. Remove gloves and wash your hands immediately after handling these items.  Do not share dishes, glasses, or other household items with the patient Avoid sharing household items. You should not share dishes, drinking glasses, cups, eating utensils, towels, bedding, or other items After the person uses these items, you should wash them thoroughly with soap and water.  Wash laundry thoroughly Immediately remove and wash clothes or bedding that have blood, body fluids, and/or secretions or excretions, such as sweat, saliva, sputum, nasal mucus, vomit, urine, or feces, on them. Wear gloves when handling laundry from the patient. Read and follow directions on labels of laundry or clothing items and detergent. In general, wash and dry with the warmest temperatures recommended on the label.  Clean all areas the individual has used often Clean all touchable surfaces, such as counters, tabletops, doorknobs, bathroom fixtures, toilets, phones, keyboards, tablets, and bedside tables, every day. Also, clean any surfaces that may have blood, body fluids, and/or secretions or excretions on them. Wear gloves when cleaning surfaces the patient has come in contact with. Use a diluted  bleach solution (e.g., dilute bleach with 1 part bleach and 10 parts water) or a household disinfectant with a label that says EPA-registered for coronaviruses. To make a bleach solution at home, add 1 tablespoon of bleach to 1 quart (4 cups) of water. For a larger supply, add  cup of bleach to 1 gallon (16 cups) of water. Read labels of cleaning products and follow recommendations provided on product labels. Labels contain instructions for safe and effective use of the cleaning product including precautions you should take when applying the product, such as wearing gloves or eye protection and making sure you have good ventilation during use of the product. Remove gloves and wash hands immediately after cleaning.  Monitor yourself for signs and symptoms of illness Caregivers and household members are considered close contacts, should monitor their health, and will be asked to limit movement outside of the home to the extent possible. Follow the monitoring steps for close contacts listed on the symptom monitoring form.   ? If you have additional questions, contact your local health department or call the epidemiologist on call at [phone] (available 24/7). ? This guidance is subject to change. For the most up-to-date guidance from [facility], please refer to their website: [email or web address]

## 2018-10-26 LAB — NOVEL CORONAVIRUS, NAA (HOSP ORDER, SEND-OUT TO REF LAB; TAT 18-24 HRS): SARS-CoV-2, NAA: NOT DETECTED

## 2019-10-10 IMAGING — DX LUMBAR SPINE - COMPLETE 4+ VIEW
6 series · 6 of 6 positions shown · non-contrast
Comparison: None.

CLINICAL DATA: Chronic lumbar pain, mult injuries w/ fxs of l4
12Jl1, fall yesterday

EXAM:
LUMBAR SPINE - COMPLETE 4+ VIEW

[l-spine ap (1 of 2)]
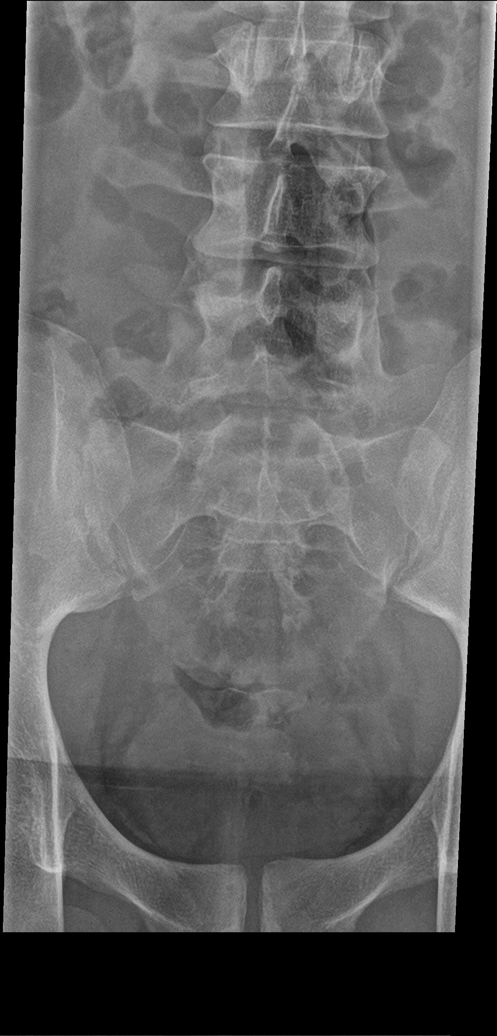

[l-spine obl (1 of 2)]
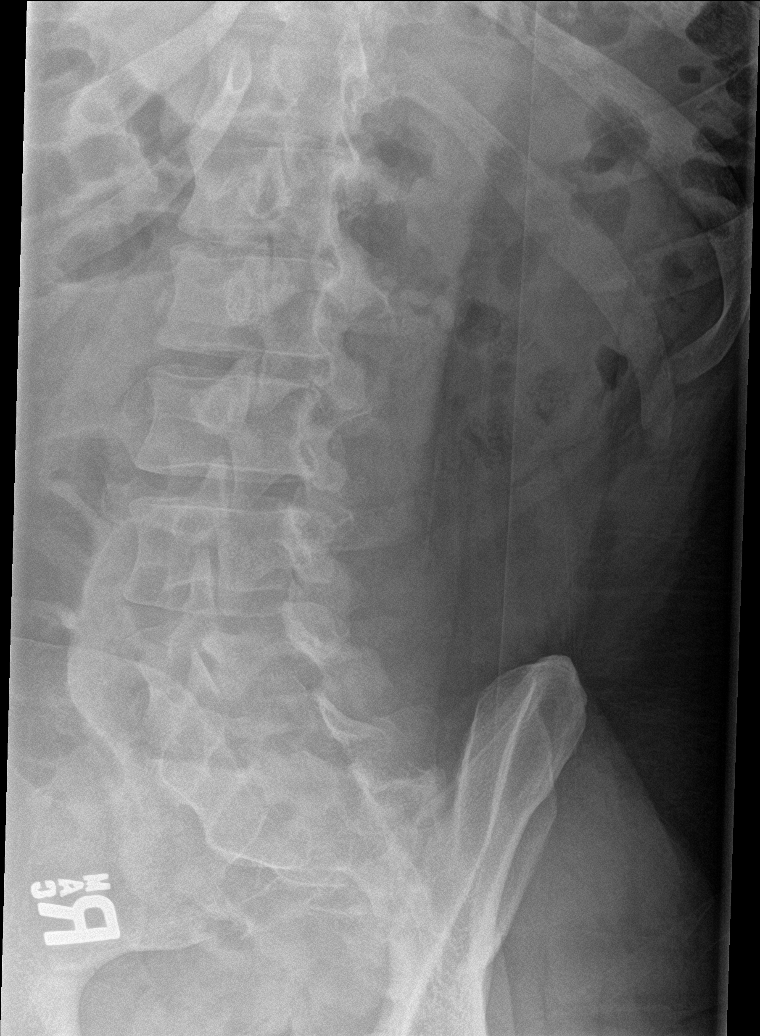

[l-spine obl (2 of 2)]
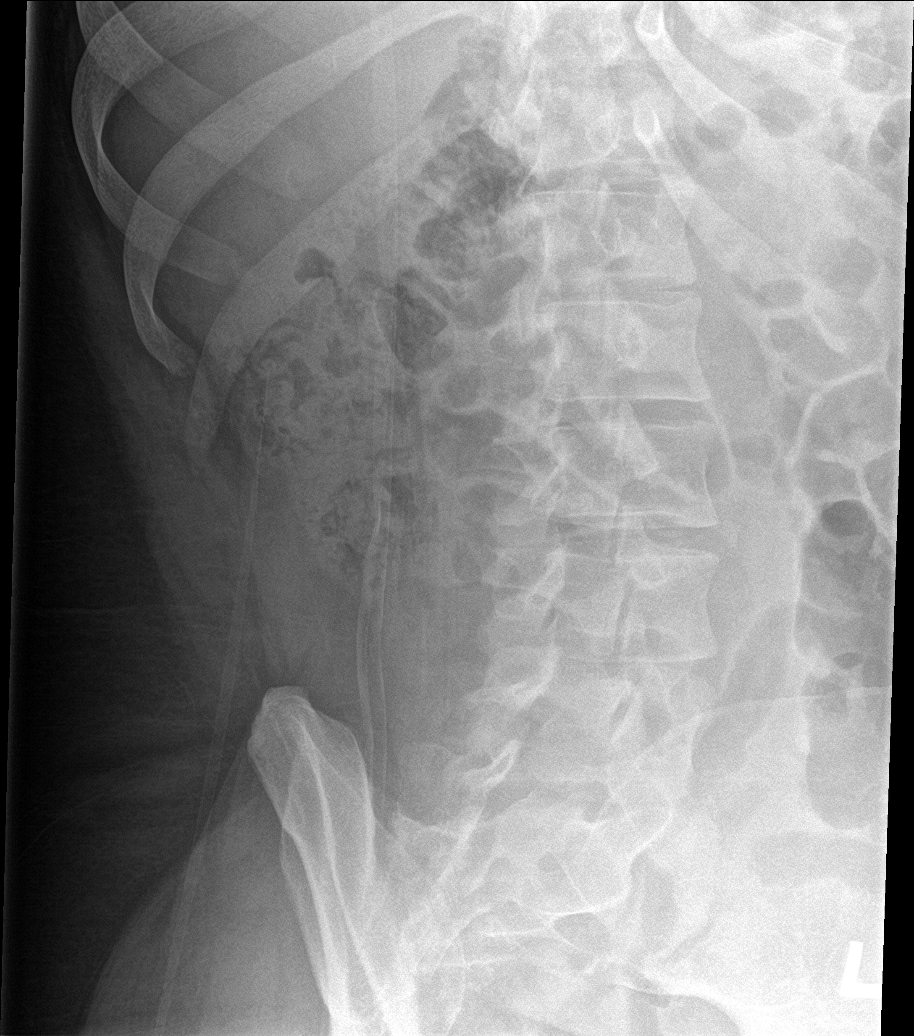

[l-spine lat]
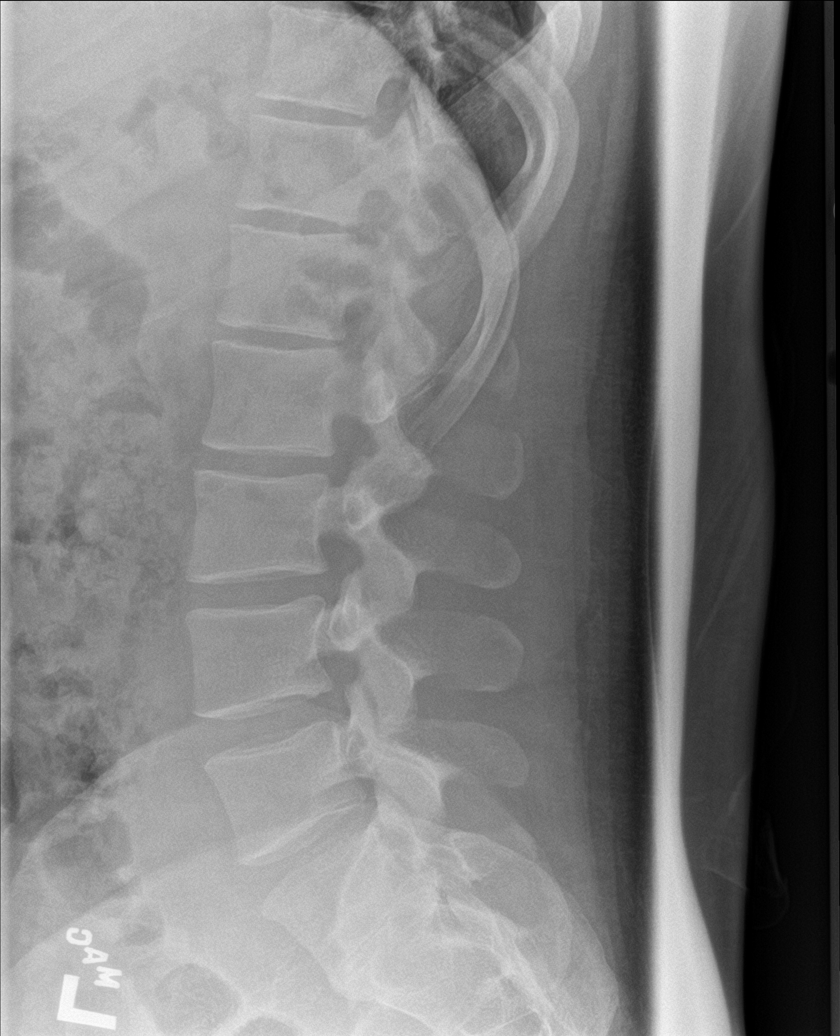

[l-spine spot]
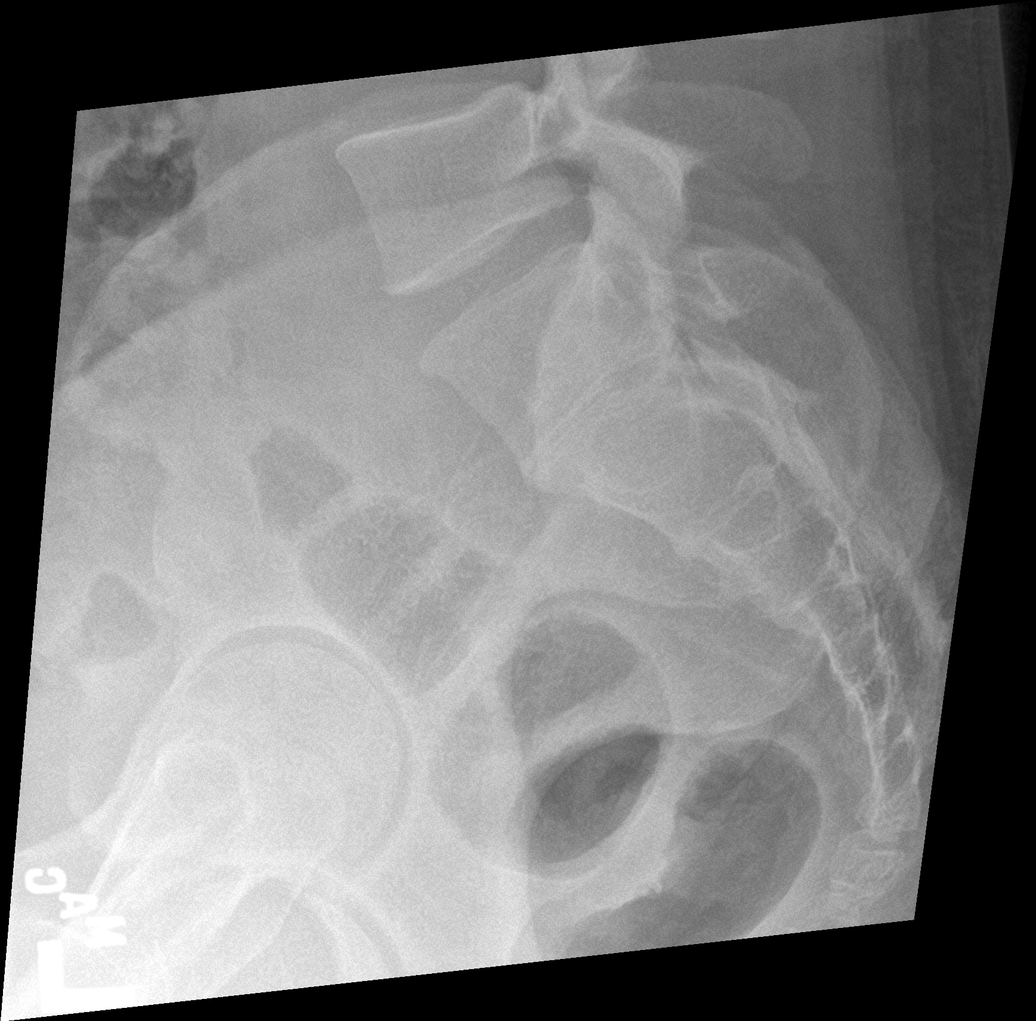

[l-spine ap (2 of 2)]
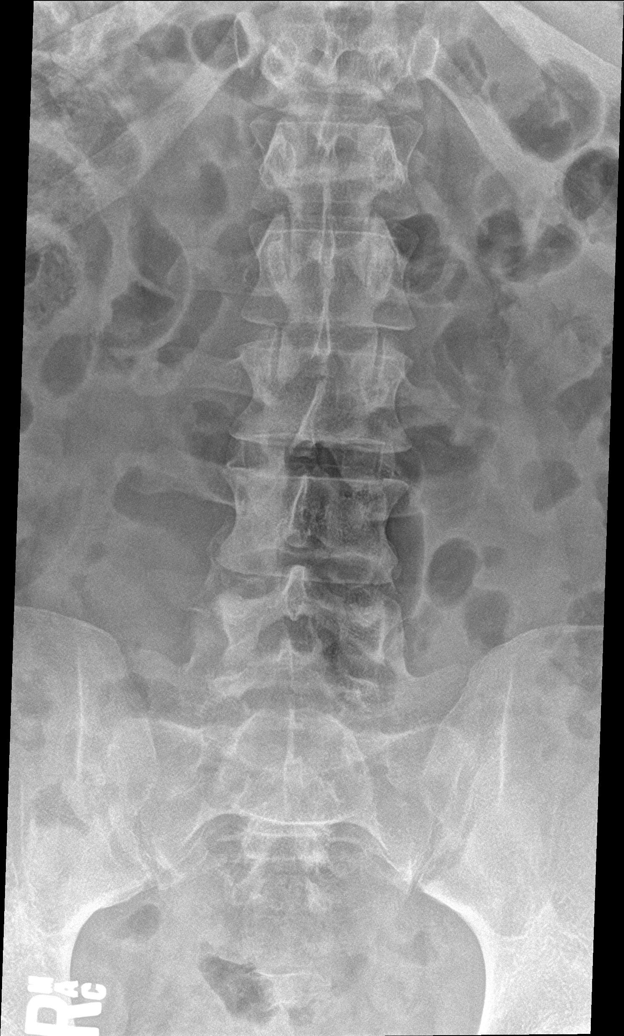

[6 of 6 positions shown; findings below may reference images not displayed]

FINDINGS: There is no evidence of lumbar spine fracture. Alignment is normal.
Intervertebral disc spaces are maintained.
IMPRESSION: Negative.

## 2020-07-29 ENCOUNTER — Encounter (HOSPITAL_BASED_OUTPATIENT_CLINIC_OR_DEPARTMENT_OTHER): Payer: Self-pay

## 2020-07-29 ENCOUNTER — Emergency Department (HOSPITAL_BASED_OUTPATIENT_CLINIC_OR_DEPARTMENT_OTHER)
Admission: EM | Admit: 2020-07-29 | Discharge: 2020-07-29 | Disposition: A | Discharge status: Home / Self Care | Payer: Self-pay | Attending: Emergency Medicine | Admitting: Emergency Medicine

## 2020-07-29 ENCOUNTER — Other Ambulatory Visit: Payer: Self-pay

## 2020-07-29 DIAGNOSIS — Z202 Contact with and (suspected) exposure to infections with a predominantly sexual mode of transmission: Secondary | ICD-10-CM | POA: Insufficient documentation

## 2020-07-29 DIAGNOSIS — F17210 Nicotine dependence, cigarettes, uncomplicated: Secondary | ICD-10-CM | POA: Insufficient documentation

## 2020-07-29 LAB — HIV ANTIBODY (ROUTINE TESTING W REFLEX): HIV Screen 4th Generation wRfx: NONREACTIVE

## 2020-07-29 NOTE — ED Triage Notes (Signed)
Pt reports a partner notified him of Herpes confirmation.

## 2020-07-29 NOTE — ED Provider Notes (Signed)
[facility] EMERGENCY DEPARTMENT Provider Note   CSN: [id number] Arrival date & time: 07/29/20  0220     History Chief Complaint  Patient presents with   Exposure to STD    Jonathan Wong is a 35 y.o. male.  Patient is asymptomatic but his girlfriend has a lesion on her vagina that he thinks might be herpes and just wants to be checked.  States he does have unprotected intercourse.  He has no discharge, dysuria, pain with intercourse, rashes or any other associated symptoms.   Exposure to STD      History reviewed. No pertinent past medical history.  There are no problems to display for this patient.   Past Surgical History:  Procedure Laterality Date   Back and neck fracture     TYMPANOSTOMY TUBE PLACEMENT         No family history on file.  Social History   Tobacco Use   Smoking status: Every Day    Packs/day: 1.00    Types: Cigarettes   Smokeless tobacco: Never  Vaping Use   Vaping Use: Never used  Substance Use Topics   Alcohol use: No   Drug use: No    Home Medications Prior to Admission medications   Not on File    Allergies    Patient has no known allergies.  Review of Systems   Review of Systems  All other systems reviewed and are negative.  Physical Exam Updated Vital Signs BP (!) 150/86   Pulse 67   Temp 98.1 F (36.7 C)   Resp 19   SpO2 100%   Physical Exam Vitals and nursing note reviewed.  Constitutional:      Appearance: He is well-developed.  HENT:     Head: Normocephalic and atraumatic.     Mouth/Throat:     Mouth: Mucous membranes are moist.     Pharynx: Oropharynx is clear.  Eyes:     Pupils: Pupils are equal, round, and reactive to light.  Cardiovascular:     Rate and Rhythm: Normal rate.  Pulmonary:     Effort: Pulmonary effort is normal. No respiratory distress.  Abdominal:     General: Abdomen is flat. There is no distension.  Genitourinary:    Penis: Normal.   Musculoskeletal:         General: No swelling or tenderness. Normal range of motion.     Cervical back: Normal range of motion.  Skin:    General: Skin is warm and dry.     Findings: No rash.  Neurological:     General: No focal deficit present.     Mental Status: He is alert.    ED Results / Procedures / Treatments   Labs (all labs ordered are listed, but only abnormal results are displayed) Labs Reviewed  RPR  HIV ANTIBODY (ROUTINE TESTING W REFLEX)  GC/CHLAMYDIA PROBE AMP ([place]) NOT AT [facility]    EKG None  Radiology No results found.  Procedures Procedures   Medications Ordered in ED Medications - No data to display  ED Course  I have reviewed the triage vital signs and the nursing notes.  Pertinent labs & imaging results that were available during my care of the patient were reviewed by me and considered in my medical decision making (see chart for details).    MDM Rules/Calculators/A&P                          No  evidence of herpetic lesions to test for.  No indication for prophylactic treatment.  We will go and test for gonorrhea, chlamydia, HIV and syphilis.  Stable for discharge.  Final Clinical Impression(s) / ED Diagnoses Final diagnoses:  Possible exposure to STD    Rx / DC Orders ED Discharge Orders     None        Keerat Denicola, Barbara Cower, MD 07/29/20 [phone]

## 2020-07-30 LAB — RPR: RPR Ser Ql: NONREACTIVE

## 2020-07-31 LAB — GC/CHLAMYDIA PROBE AMP (~~LOC~~) NOT AT ARMC
Chlamydia: NEGATIVE
Comment: NEGATIVE
Comment: NORMAL
Neisseria Gonorrhea: NEGATIVE

## 2020-10-16 ENCOUNTER — Emergency Department (HOSPITAL_BASED_OUTPATIENT_CLINIC_OR_DEPARTMENT_OTHER)
Admission: EM | Admit: 2020-10-16 | Discharge: 2020-10-17 | Disposition: A | Discharge status: Home / Self Care | Payer: Self-pay | Attending: Emergency Medicine | Admitting: Emergency Medicine

## 2020-10-16 ENCOUNTER — Emergency Department (HOSPITAL_BASED_OUTPATIENT_CLINIC_OR_DEPARTMENT_OTHER): Payer: Self-pay

## 2020-10-16 ENCOUNTER — Other Ambulatory Visit: Payer: Self-pay

## 2020-10-16 DIAGNOSIS — F17210 Nicotine dependence, cigarettes, uncomplicated: Secondary | ICD-10-CM | POA: Insufficient documentation

## 2020-10-16 DIAGNOSIS — S62346A Nondisplaced fracture of base of fifth metacarpal bone, right hand, initial encounter for closed fracture: Secondary | ICD-10-CM | POA: Insufficient documentation

## 2020-10-16 DIAGNOSIS — M25541 Pain in joints of right hand: Secondary | ICD-10-CM | POA: Insufficient documentation

## 2020-10-16 DIAGNOSIS — R0789 Other chest pain: Secondary | ICD-10-CM | POA: Insufficient documentation

## 2020-10-16 DIAGNOSIS — M545 Low back pain, unspecified: Secondary | ICD-10-CM | POA: Insufficient documentation

## 2020-10-16 DIAGNOSIS — M542 Cervicalgia: Secondary | ICD-10-CM | POA: Insufficient documentation

## 2020-10-16 DIAGNOSIS — X58XXXA Exposure to other specified factors, initial encounter: Secondary | ICD-10-CM | POA: Insufficient documentation

## 2020-10-16 LAB — CBC
HCT: 45.9 % (ref 39.0–52.0)
Hemoglobin: 15 g/dL (ref 13.0–17.0)
MCH: 27 pg (ref 26.0–34.0)
MCHC: 32.7 g/dL (ref 30.0–36.0)
MCV: 82.7 fL (ref 80.0–100.0)
Platelets: 142 10*3/uL — ABNORMAL LOW (ref 150–400)
RBC: 5.55 MIL/uL (ref 4.22–5.81)
RDW: 16.4 % — ABNORMAL HIGH (ref 11.5–15.5)
WBC: 7.1 10*3/uL (ref 4.0–10.5)
nRBC: 0 % (ref 0.0–0.2)

## 2020-10-16 LAB — BASIC METABOLIC PANEL
Anion gap: 9 (ref 5–15)
BUN: 15 mg/dL (ref 6–20)
CO2: 24 mmol/L (ref 22–32)
Calcium: 9.4 mg/dL (ref 8.9–10.3)
Chloride: 103 mmol/L (ref 98–111)
Creatinine, Ser: 1.19 mg/dL (ref 0.61–1.24)
GFR, Estimated: >60 mL/min (ref >60)
Glucose, Bld: 119 mg/dL — ABNORMAL HIGH (ref 70–99)
Potassium: 3.6 mmol/L (ref 3.5–5.1)
Sodium: 136 mmol/L (ref 135–145)

## 2020-10-16 LAB — TROPONIN I (HIGH SENSITIVITY): Troponin I (High Sensitivity): 2 ng/L (ref <18)

## 2020-10-16 NOTE — ED Triage Notes (Signed)
Pt c/o left scapula pain radiating to chest x 5 days. States pain worse with deep inspiration.

## 2020-10-17 ENCOUNTER — Emergency Department (HOSPITAL_BASED_OUTPATIENT_CLINIC_OR_DEPARTMENT_OTHER): Payer: Self-pay

## 2020-10-17 MED ORDER — PREDNISONE 10 MG PO TABS: 20.0000 mg | ORAL_TABLET | Freq: Two times a day (BID) | ORAL | 0 refills | Status: DC

## 2020-10-17 MED ORDER — HYDROCODONE-ACETAMINOPHEN 5-325 MG PO TABS: 2.0000 | ORAL_TABLET | ORAL | 0 refills | Status: DC | PRN

## 2020-10-17 MED ORDER — OXYCODONE-ACETAMINOPHEN 5-325 MG PO TABS
2.0000 | ORAL_TABLET | Freq: Once | ORAL | Status: AC
  Administered 2020-10-17: 2 via ORAL
  Filled 2020-10-17: qty 2

## 2020-10-17 MED ORDER — DEXAMETHASONE SODIUM PHOSPHATE 10 MG/ML IJ SOLN
10.0000 mg | Freq: Once | INTRAMUSCULAR | Status: AC
  Administered 2020-10-17: 10 mg via INTRAMUSCULAR
  Filled 2020-10-17: qty 1

## 2020-10-17 NOTE — Discharge Instructions (Addendum)
Wear splint as applied until followed up by orthopedics.  The contact information for Dr. Georga Bora has been provided in this discharge summary for you to call and make these arrangements.  Ice for 20 minutes every 2 hours while awake for the next 2 days.  Begin taking prednisone as prescribed and hydrocodone as prescribed as needed for pain.

## 2020-10-17 NOTE — ED Provider Notes (Signed)
[facility] EMERGENCY DEPARTMENT Provider Note   CSN: [id number] Arrival date & time: 10/16/20  1950     History Chief Complaint  Patient presents with   Chest Pain    Jonathan Wong is a 35 y.o. male.  Patient is a 35 year old male presenting with pain in his left neck and back.  This has been present for the past 5 days.  Pain is worse when he moves, changes position, and takes a deep breath.  He denies any shortness of breath.  Denies any fevers, chills, or cough.  He also reports punching a refrigerator and believes that his right fifth finger is dislocated.  The history is provided by the patient.  Chest Pain Chest pain location: Left posterior chest. Pain quality: sharp and stabbing   Pain radiates to:  Neck Pain severity:  Severe Onset quality:  Gradual Duration:  5 days Timing:  Constant Progression:  Worsening Chronicity:  New Relieved by:  Nothing Worsened by:  Deep breathing, movement and certain positions     No past medical history on file.  There are no problems to display for this patient.   Past Surgical History:  Procedure Laterality Date   Back and neck fracture     TYMPANOSTOMY TUBE PLACEMENT         No family history on file.  Social History   Tobacco Use   Smoking status: Every Day    Packs/day: 1.00    Types: Cigarettes   Smokeless tobacco: Never  Vaping Use   Vaping Use: Never used  Substance Use Topics   Alcohol use: No   Drug use: No    Home Medications Prior to Admission medications   Not on File    Allergies    Patient has no known allergies.  Review of Systems   Review of Systems  Cardiovascular:  Positive for chest pain.  All other systems reviewed and are negative.  Physical Exam Updated Vital Signs BP 137/87 (BP Location: Right Arm)   Pulse 82   Temp 98 F (36.7 C) (Oral)   Resp 18   Ht 6\' 1"  (1.854 m)   Wt 73.9 kg   SpO2 98%   BMI 21.51 kg/m   Physical Exam Vitals and nursing note  reviewed.  Constitutional:      General: He is not in acute distress.    Appearance: He is well-developed. He is not diaphoretic.  HENT:     Head: Normocephalic and atraumatic.  Cardiovascular:     Rate and Rhythm: Normal rate and regular rhythm.     Heart sounds: No murmur heard.   No friction rub.  Pulmonary:     Effort: Pulmonary effort is normal. No respiratory distress.     Breath sounds: Normal breath sounds. No wheezing or rales.     Comments: There is tenderness to palpation in the soft tissues of the left upper back in the area of the scapula extending to the trapezius muscle in the neck.  Pain is worse with range of motion of the left shoulder and turning his head. Abdominal:     General: Bowel sounds are normal. There is no distension.     Palpations: Abdomen is soft.     Tenderness: There is no abdominal tenderness.  Musculoskeletal:        General: Normal range of motion.     Cervical back: Normal range of motion and neck supple.     Comments: The left upper extremity is grossly normal  in appearance.  He has full range of motion of all fingers and strength is 5 out of 5.  Ulnar and radial pulses are easily palpable.  Skin:    General: Skin is warm and dry.  Neurological:     Mental Status: He is alert and oriented to person, place, and time.     Coordination: Coordination normal.    ED Results / Procedures / Treatments   Labs (all labs ordered are listed, but only abnormal results are displayed) Labs Reviewed  BASIC METABOLIC PANEL - Abnormal; Notable for the following components:      Result Value   Glucose, Bld 119 (*)    All other components within normal limits  CBC - Abnormal; Notable for the following components:   RDW 16.4 (*)    Platelets 142 (*)    All other components within normal limits  TROPONIN I (HIGH SENSITIVITY)  TROPONIN I (HIGH SENSITIVITY)    EKG EKG Interpretation  Date/Time:  Monday October 16 2020 19:59:50 EDT Ventricular Rate:   77 PR Interval:  158 QRS Duration: 90 QT Interval:  350 QTC Calculation: 396 R Axis:   73 Text Interpretation: Normal sinus rhythm Nonspecific ST and T wave abnormality Abnormal ECG Confirmed by Geoffery Lyons ([id number]) on 10/16/2020 11:51:56 PM  Radiology DG Chest 2 View  Result Date: 10/16/2020 CLINICAL DATA:  Chest pain. EXAM: CHEST - 2 VIEW COMPARISON:  October 18, 2015 FINDINGS: The heart size and mediastinal contours are within normal limits. Both lungs are clear. The visualized skeletal structures are unremarkable. IMPRESSION: No active cardiopulmonary disease. Electronically Signed   By: Aram Candela M.D.   On: 10/16/2020 20:16    Procedures Procedures   Medications Ordered in ED Medications  dexamethasone (DECADRON) injection 10 mg (has no administration in time range)  oxyCODONE-acetaminophen (PERCOCET/ROXICET) 5-325 MG per tablet 2 tablet (has no administration in time range)    ED Course  I have reviewed the triage vital signs and the nursing notes.  Pertinent labs & imaging results that were available during my care of the patient were reviewed by me and considered in my medical decision making (see chart for details).    MDM Rules/Calculators/A&P  Patient presenting here with complaints of pain in his neck and back and also a right hand injury.  His neck and back pain seems very musculoskeletal in nature.  Cardiac work-up unremarkable and chest x-ray is clear.  To be treated with prednisone and pain medication.  His x-rays do show 1/5 metacarpal fracture.  He will be placed in an ulnar gutter splint and followed up with hand surgery as an outpatient.  Final Clinical Impression(s) / ED Diagnoses Final diagnoses:  None    Rx / DC Orders ED Discharge Orders     None        Geoffery Lyons, MD 10/17/20 [phone]

## 2021-01-26 ENCOUNTER — Emergency Department (HOSPITAL_BASED_OUTPATIENT_CLINIC_OR_DEPARTMENT_OTHER)
Admission: EM | Admit: 2021-01-26 | Discharge: 2021-01-26 | Disposition: A | Discharge status: Home / Self Care | Payer: Self-pay | Attending: Emergency Medicine | Admitting: Emergency Medicine

## 2021-01-26 ENCOUNTER — Encounter (HOSPITAL_BASED_OUTPATIENT_CLINIC_OR_DEPARTMENT_OTHER): Payer: Self-pay

## 2021-01-26 ENCOUNTER — Other Ambulatory Visit: Payer: Self-pay

## 2021-01-26 DIAGNOSIS — Z20822 Contact with and (suspected) exposure to covid-19: Secondary | ICD-10-CM | POA: Insufficient documentation

## 2021-01-26 DIAGNOSIS — R112 Nausea with vomiting, unspecified: Secondary | ICD-10-CM | POA: Insufficient documentation

## 2021-01-26 DIAGNOSIS — R1013 Epigastric pain: Secondary | ICD-10-CM | POA: Insufficient documentation

## 2021-01-26 DIAGNOSIS — R6883 Chills (without fever): Secondary | ICD-10-CM | POA: Insufficient documentation

## 2021-01-26 DIAGNOSIS — R197 Diarrhea, unspecified: Secondary | ICD-10-CM | POA: Insufficient documentation

## 2021-01-26 DIAGNOSIS — M5432 Sciatica, left side: Secondary | ICD-10-CM | POA: Insufficient documentation

## 2021-01-26 LAB — RESP PANEL BY RT-PCR (FLU A&B, COVID) ARPGX2
Influenza A by PCR: NEGATIVE
Influenza B by PCR: NEGATIVE
SARS Coronavirus 2 by RT PCR: NEGATIVE

## 2021-01-26 LAB — COMPREHENSIVE METABOLIC PANEL
ALT: 43 U/L (ref 0–44)
AST: 32 U/L (ref 15–41)
Albumin: 4.1 g/dL (ref 3.5–5.0)
Alkaline Phosphatase: 51 U/L (ref 38–126)
Anion gap: 7 (ref 5–15)
BUN: 14 mg/dL (ref 6–20)
CO2: 26 mmol/L (ref 22–32)
Calcium: 9.3 mg/dL (ref 8.9–10.3)
Chloride: 105 mmol/L (ref 98–111)
Creatinine, Ser: 1.16 mg/dL (ref 0.61–1.24)
GFR, Estimated: >60 mL/min (ref >60)
Glucose, Bld: 93 mg/dL (ref 70–99)
Potassium: 3.9 mmol/L (ref 3.5–5.1)
Sodium: 138 mmol/L (ref 135–145)
Total Bilirubin: 0.4 mg/dL (ref 0.3–1.2)
Total Protein: 7.8 g/dL (ref 6.5–8.1)

## 2021-01-26 LAB — CBC
HCT: 43.2 % (ref 39.0–52.0)
Hemoglobin: 13.9 g/dL (ref 13.0–17.0)
MCH: 26.6 pg (ref 26.0–34.0)
MCHC: 32.2 g/dL (ref 30.0–36.0)
MCV: 82.6 fL (ref 80.0–100.0)
Platelets: 163 10*3/uL (ref 150–400)
RBC: 5.23 MIL/uL (ref 4.22–5.81)
RDW: 15.9 % — ABNORMAL HIGH (ref 11.5–15.5)
WBC: 6.7 10*3/uL (ref 4.0–10.5)
nRBC: 0 % (ref 0.0–0.2)

## 2021-01-26 LAB — URINALYSIS, ROUTINE W REFLEX MICROSCOPIC
Bilirubin Urine: NEGATIVE
Glucose, UA: NEGATIVE mg/dL
Hgb urine dipstick: NEGATIVE
Ketones, ur: NEGATIVE mg/dL
Leukocytes,Ua: NEGATIVE
Nitrite: NEGATIVE
Protein, ur: NEGATIVE mg/dL
Specific Gravity, Urine: >=1.03 (ref 1.005–1.030)
pH: 5.5 (ref 5.0–8.0)

## 2021-01-26 LAB — LIPASE, BLOOD: Lipase: 24 U/L (ref 11–51)

## 2021-01-26 MED ORDER — ONDANSETRON HCL 4 MG PO TABS: 4.0000 mg | ORAL_TABLET | Freq: Three times a day (TID) | ORAL | 0 refills | Status: DC | PRN

## 2021-01-26 MED ORDER — SODIUM CHLORIDE 0.9 % IV BOLUS
1000.0000 mL | Freq: Once | INTRAVENOUS | Status: AC
  Administered 2021-01-26: 1000 mL via INTRAVENOUS

## 2021-01-26 MED ORDER — DEXAMETHASONE SODIUM PHOSPHATE 10 MG/ML IJ SOLN: 10.0000 mg | Freq: Once | INTRAMUSCULAR | Status: DC

## 2021-01-26 MED ORDER — ONDANSETRON HCL 4 MG/2ML IJ SOLN
4.0000 mg | Freq: Once | INTRAMUSCULAR | Status: AC
  Administered 2021-01-26: 4 mg via INTRAVENOUS
  Filled 2021-01-26: qty 2

## 2021-01-26 NOTE — ED Provider Notes (Signed)
[facility] EMERGENCY DEPARTMENT Provider Note   CSN: [id number] Arrival date & time: 01/26/21  1603     History  Chief Complaint  Patient presents with   Abdominal Pain    Jonathan Wong is a 36 y.o. male.  He is complaint of feeling weakness in his mid abdomen along with nausea and diarrhea.  This has been going on for few days.  Diarrhea has been green.  He has had some chills but no fever.  He also has had pain in his left leg its been going on for over a year and wants to get a cortisone shot.  He said he has had this before and has responded well to it.  No numbness or weakness.  No bowel or bladder incontinence.  The history is provided by the patient.  Abdominal Pain Pain location:  Epigastric Pain quality comment:  "weakness" Pain radiates to:  Does not radiate Onset quality:  Gradual Duration:  4 days Timing:  Intermittent Progression:  Unchanged Chronicity:  New Context: not recent travel, not sick contacts and not suspicious food intake   Relieved by:  None tried Worsened by:  Nothing Ineffective treatments:  None tried Associated symptoms: chills, diarrhea and nausea   Associated symptoms: no chest pain, no constipation, no cough, no dysuria, no fever, no shortness of breath, no sore throat and no vomiting       Home Medications Prior to Admission medications   Medication Sig Start Date End Date Taking? Authorizing Provider  HYDROcodone-acetaminophen (NORCO) 5-325 MG tablet Take 2 tablets by mouth every 4 (four) hours as needed. 10/17/20   Veryl Speak, MD  predniSONE (DELTASONE) 10 MG tablet Take 2 tablets (20 mg total) by mouth 2 (two) times daily. 10/17/20   Veryl Speak, MD      Allergies    Patient has no known allergies.    Review of Systems   Review of Systems  Constitutional:  Positive for chills. Negative for fever.  HENT:  Negative for sore throat.   Eyes:  Negative for visual disturbance.  Respiratory:  Negative for cough and  shortness of breath.   Cardiovascular:  Negative for chest pain.  Gastrointestinal:  Positive for abdominal pain, diarrhea and nausea. Negative for constipation and vomiting.  Genitourinary:  Negative for dysuria.  Musculoskeletal:  Negative for back pain.  Skin:  Negative for rash.  Neurological:  Negative for headaches.   Physical Exam Updated Vital Signs BP 112/68 (BP Location: Left Arm)    Pulse 70    Temp 98 F (36.7 C) (Oral)    Resp 16    Ht 6\' 1"  (1.854 m)    Wt 117 kg    SpO2 100%    BMI 34.04 kg/m  Physical Exam Vitals and nursing note reviewed.  Constitutional:      General: He is not in acute distress.    Appearance: Normal appearance. He is well-developed.  HENT:     Head: Normocephalic and atraumatic.  Eyes:     Conjunctiva/sclera: Conjunctivae normal.  Cardiovascular:     Rate and Rhythm: Normal rate and regular rhythm.     Heart sounds: No murmur heard. Pulmonary:     Effort: Pulmonary effort is normal. No respiratory distress.     Breath sounds: Normal breath sounds.  Abdominal:     Palpations: Abdomen is soft.     Tenderness: There is no abdominal tenderness. There is no guarding or rebound.  Musculoskeletal:  General: No swelling.     Cervical back: Neck supple.     Right lower leg: No edema.     Left lower leg: No edema.  Skin:    General: Skin is warm and dry.     Capillary Refill: Capillary refill takes less than 2 seconds.  Neurological:     General: No focal deficit present.     Mental Status: He is alert.     Sensory: No sensory deficit.     Motor: No weakness.     Gait: Gait normal.  Psychiatric:        Mood and Affect: Mood normal.    ED Results / Procedures / Treatments   Labs (all labs ordered are listed, but only abnormal results are displayed) Labs Reviewed  CBC - Abnormal; Notable for the following components:      Result Value   RDW 15.9 (*)    All other components within normal limits  RESP PANEL BY RT-PCR (FLU A&B,  COVID) ARPGX2  LIPASE, BLOOD  COMPREHENSIVE METABOLIC PANEL  URINALYSIS, ROUTINE W REFLEX MICROSCOPIC    EKG None  Radiology No results found.  Procedures Procedures    Medications Ordered in ED Medications  sodium chloride 0.9 % bolus 1,000 mL (has no administration in time range)  ondansetron (ZOFRAN) injection 4 mg (has no administration in time range)    ED Course/ Medical Decision Making/ A&P                           Medical Decision Making  This patient complains of abdominal pain and diarrhea, left leg pain x1 year; this involves an extensive number of treatment Options and is a complaint that carries with it a high risk of complications and Morbidity. The differential includes gastroenteritis, colitis, COVID, flu, sciatica, musculoskeletal, dehydration, metabolic derangement  I ordered, reviewed and interpreted labs, which included CBC with normal white count normal hemoglobin, chemistries and LFTs normal, urinalysis unremarkable, COVID and flu negative I ordered medication IV fluids and nausea medication  Previous records obtained and reviewed in epic, no recent admissions  After the interventions stated above, I reevaluated the patient and found patient to be improved.  Commended symptomatic treatment at home.  Return instructions discussed.         Final Clinical Impression(s) / ED Diagnoses Final diagnoses:  Nausea vomiting and diarrhea  Sciatica of left side    Rx / DC Orders ED Discharge Orders          Ordered    ondansetron (ZOFRAN) 4 MG tablet  Every 8 hours PRN        01/26/21 1943              Hayden Rasmussen, MD 01/27/21 [id number]

## 2021-01-26 NOTE — ED Notes (Signed)
First contact with patient. Patient arrived via triage from home with complaints of mid abdominal pain n/diarrhea - patients girlfriend has the same symptoms. Patient states he is also here for is left leg sciatica and wanting a cortisone shot. Patient is A&OX 4. Respirations even/unlabored. Patient changed into gown and placed on monitor and call light within reach. Patient updated on plan of care. Will continue to monitor patient.

## 2021-01-26 NOTE — ED Triage Notes (Signed)
Pt c/o abd pain, "lime green diarrhea" x 4 days-also c/o "sciatica pain"-NAD-steady gait

## 2021-01-26 NOTE — Discharge Instructions (Addendum)
You are seen in the emergency department for nausea and diarrhea along with some chronic left leg pain.  Your lab work did not show any significant abnormalities.  Your COVID and flu test were negative.  We are prescribing some nausea medication.  We will be important for you to get a primary care doctor.  Return to the emergency department if any worsening or concerning symptoms

## 2021-04-15 ENCOUNTER — Emergency Department (HOSPITAL_BASED_OUTPATIENT_CLINIC_OR_DEPARTMENT_OTHER)
Admission: EM | Admit: 2021-04-15 | Discharge: 2021-04-16 | Disposition: A | Discharge status: Home / Self Care | Payer: Self-pay | Attending: Emergency Medicine | Admitting: Emergency Medicine

## 2021-04-15 ENCOUNTER — Encounter (HOSPITAL_BASED_OUTPATIENT_CLINIC_OR_DEPARTMENT_OTHER): Payer: Self-pay | Admitting: Urology

## 2021-04-15 ENCOUNTER — Other Ambulatory Visit: Payer: Self-pay

## 2021-04-15 DIAGNOSIS — H66001 Acute suppurative otitis media without spontaneous rupture of ear drum, right ear: Secondary | ICD-10-CM | POA: Insufficient documentation

## 2021-04-15 DIAGNOSIS — F17210 Nicotine dependence, cigarettes, uncomplicated: Secondary | ICD-10-CM | POA: Insufficient documentation

## 2021-04-15 NOTE — ED Provider Notes (Signed)
? ?[facility] ?Provider Note: Lowella Dell, MD, FACEP ? ?CSN: [id number] ?MRN: [id number] ?ARRIVAL: 04/15/21 at 2350 ?ROOM: [facility] ? ? ?CHIEF COMPLAINT  ?Ear Fullness ? ? ?HISTORY OF PRESENT ILLNESS  ?04/15/21 11:58 PM ?Jonathan Wong is a 36 y.o. male who has been unable to hear out of his right ear for the past 3 days.  He has a history of bilateral tympanostomy tubes.  He has a sensation of fullness in the right ear but no frank pain.  ? ? ?History reviewed. No pertinent past medical history. ? ?Past Surgical History:  ?Procedure Laterality Date  ? TYMPANOSTOMY TUBE PLACEMENT    ? ? ?History reviewed. No pertinent family history. ? ?Social History  ? ?Tobacco Use  ? Smoking status: Every Day  ?  Packs/day: 1.00  ?  Types: Cigarettes  ? Smokeless tobacco: Never  ?Vaping Use  ? Vaping Use: Never used  ?Substance Use Topics  ? Alcohol use: No  ? Drug use: No  ? ? ?Prior to Admission medications   ?Medication Sig Start Date End Date Taking? Authorizing Provider  ?amoxicillin (AMOXIL) 500 MG capsule Take 2 capsules (1,000 mg total) by mouth 3 (three) times daily for 7 days. 04/16/21 04/23/21 Yes Devean Skoczylas, Jonny Ruiz, MD  ? ? ?Allergies ?Patient has no known allergies. ? ? ?REVIEW OF SYSTEMS  ?Negative except as noted here or in the History of Present Illness. ? ? ?PHYSICAL EXAMINATION  ?Initial Vital Signs ?Blood pressure 133/80, pulse 79, temperature 98.1 ?F (36.7 ?C), temperature source Oral, resp. rate 18, height 6\' 1"  (1.854 m), weight 111.6 kg, SpO2 95 %. ? ?Examination ?General: Well-developed, well-nourished male in no acute distress; appearance consistent with age of record ?HENT: normocephalic; atraumatic; left TM normal; right TM erythematous; no myringotomy tubes seen ?Eyes: Normal appearance ?Neck: supple ?Heart: regular rate and rhythm ?Lungs: clear to auscultation bilaterally ?Abdomen: soft; nondistended; nontender; bowel sounds present ?Extremities: No deformity; full range of motion ?Neurologic:  Awake, alert and oriented; motor function intact in all extremities and symmetric; no facial droop ?Skin: Warm and dry ?Psychiatric: Normal mood and affect ? ? ?RESULTS  ?Summary of this visit's results, reviewed and interpreted by myself: ? ? EKG Interpretation ? ?Date/Time:    ?Ventricular Rate:    ?PR Interval:    ?QRS Duration:   ?QT Interval:    ?QTC Calculation:   ?R Axis:     ?Text Interpretation:   ?  ? ?  ? ?Laboratory Studies: ?No results found for this or any previous visit (from the past 24 hour(s)). ?Imaging Studies: ?No results found. ? ?ED COURSE and MDM  ?Nursing notes, initial and subsequent vitals signs, including pulse oximetry, reviewed and interpreted by myself. ? ?Vitals:  ? 04/15/21 2355 04/15/21 2356  ?BP:  133/80  ?Pulse:  79  ?Resp:  18  ?Temp:  98.1 ?F (36.7 ?C)  ?TempSrc:  Oral  ?SpO2:  95%  ?Weight: 111.6 kg   ?Height: 6\' 1"  (1.854 m)   ? ?Medications  ?amoxicillin (AMOXIL) capsule 1,000 mg (has no administration in time range)  ? ? ?Patient's presentation consistent with acute right otitis media.  Although he has a history of myringotomy tubes none are present currently.  We will start him on amoxicillin for otitis media. ? ?PROCEDURES  ?Procedures ? ? ?ED DIAGNOSES  ? ?  ICD-10-CM   ?1. Non-recurrent acute suppurative otitis media of right ear without spontaneous rupture of tympanic membrane  H66.001   ?  ? ? ? ?  ?  Paula Libra, MD ?04/16/21 0004 ? ?

## 2021-04-15 NOTE — ED Triage Notes (Signed)
Pt state unable to hear out of right ear since Thursday ?States has ear tubes in place at this time ?Denies any ear pain  ? ?

## 2021-04-16 MED ORDER — AMOXICILLIN 500 MG PO CAPS
1000.0000 mg | ORAL_CAPSULE | Freq: Once | ORAL | Status: AC
  Administered 2021-04-16: 1000 mg via ORAL
  Filled 2021-04-16: qty 2

## 2021-04-16 MED ORDER — AMOXICILLIN 500 MG PO CAPS: 1000.0000 mg | ORAL_CAPSULE | Freq: Three times a day (TID) | ORAL | 0 refills | Status: AC

## 2021-04-16 NOTE — ED Notes (Signed)
Patient discharged to home.  All discharge instructions reviewed.  Patient verbalized understanding via teachback method.  VS WDL.  Respirations even and unlabored.  Ambulatory out of ED.   °

## 2022-02-09 ENCOUNTER — Encounter (HOSPITAL_BASED_OUTPATIENT_CLINIC_OR_DEPARTMENT_OTHER): Payer: Self-pay

## 2022-02-09 ENCOUNTER — Other Ambulatory Visit: Payer: Self-pay

## 2022-02-09 DIAGNOSIS — Z20822 Contact with and (suspected) exposure to covid-19: Secondary | ICD-10-CM | POA: Diagnosis not present

## 2022-02-09 DIAGNOSIS — R509 Fever, unspecified: Secondary | ICD-10-CM | POA: Diagnosis present

## 2022-02-09 DIAGNOSIS — J101 Influenza due to other identified influenza virus with other respiratory manifestations: Secondary | ICD-10-CM | POA: Insufficient documentation

## 2022-02-09 LAB — RESP PANEL BY RT-PCR (RSV, FLU A&B, COVID)  RVPGX2
Influenza A by PCR: POSITIVE — AB
Influenza B by PCR: NEGATIVE
Resp Syncytial Virus by PCR: NEGATIVE
SARS Coronavirus 2 by RT PCR: NEGATIVE

## 2022-02-09 LAB — GROUP A STREP BY PCR: Group A Strep by PCR: NOT DETECTED

## 2022-02-09 MED ORDER — ACETAMINOPHEN 325 MG PO TABS
650.0000 mg | ORAL_TABLET | Freq: Once | ORAL | Status: AC | PRN
  Administered 2022-02-09: 650 mg via ORAL
  Filled 2022-02-09: qty 2

## 2022-02-09 NOTE — ED Triage Notes (Signed)
Pt reports 24 hours of "feeling like crap". States he has chills and SOB. Pt coming from work and has a fever. Denies asthma, COPD, CHF.

## 2022-02-10 ENCOUNTER — Emergency Department (HOSPITAL_BASED_OUTPATIENT_CLINIC_OR_DEPARTMENT_OTHER)
Admission: EM | Admit: 2022-02-10 | Discharge: 2022-02-10 | Disposition: A | Discharge status: Home / Self Care | Payer: Medicaid Other | Attending: Emergency Medicine | Admitting: Emergency Medicine

## 2022-02-10 DIAGNOSIS — J101 Influenza due to other identified influenza virus with other respiratory manifestations: Secondary | ICD-10-CM

## 2022-02-10 NOTE — ED Provider Notes (Signed)
[facility] Provider Note   CSN: [id number] Arrival date & time: 02/09/22  2010     History  Chief Complaint  Patient presents with   Fever    Samyak Sackmann is a 37 y.o. male.  The history is provided by the patient.  Fever Temp source:  Oral Severity:  Moderate Onset quality:  Gradual Duration:  1 day Timing:  Constant Progression:  Unchanged Chronicity:  New Relieved by:  Nothing Worsened by:  Nothing Ineffective treatments:  None tried Associated symptoms: myalgias   Associated symptoms: no chest pain   Risk factors: no contaminated food        Home Medications Prior to Admission medications   Not on File      Allergies    Patient has no known allergies.    Review of Systems   Review of Systems  Constitutional:  Positive for fever.  HENT:  Negative for facial swelling.   Eyes:  Negative for redness.  Respiratory:  Negative for shortness of breath.   Cardiovascular:  Negative for chest pain.  Musculoskeletal:  Positive for myalgias.  All other systems reviewed and are negative.   Physical Exam Updated Vital Signs BP 111/79 (BP Location: Left Arm)   Pulse 90   Temp 98.9 F (37.2 C) (Oral)   Resp (!) 21   Ht 6\' 1"  (1.854 m)   Wt 119.7 kg   SpO2 97%   BMI 34.83 kg/m  Physical Exam Vitals and nursing note reviewed.  Constitutional:      General: He is not in acute distress.    Appearance: Normal appearance. He is well-developed. He is not diaphoretic.  HENT:     Head: Normocephalic and atraumatic.     Nose: Nose normal.  Eyes:     Conjunctiva/sclera: Conjunctivae normal.     Pupils: Pupils are equal, round, and reactive to light.  Cardiovascular:     Rate and Rhythm: Normal rate and regular rhythm.     Pulses: Normal pulses.     Heart sounds: Normal heart sounds.  Pulmonary:     Effort: Pulmonary effort is normal.     Breath sounds: Normal breath sounds. No wheezing or rales.  Abdominal:      General: Abdomen is flat. Bowel sounds are normal.     Palpations: Abdomen is soft.     Tenderness: There is no abdominal tenderness. There is no guarding or rebound.  Musculoskeletal:        General: Normal range of motion.     Cervical back: Normal range of motion and neck supple.  Skin:    General: Skin is warm and dry.     Capillary Refill: Capillary refill takes less than 2 seconds.  Neurological:     General: No focal deficit present.     Mental Status: He is alert and oriented to person, place, and time.     Deep Tendon Reflexes: Reflexes normal.  Psychiatric:        Mood and Affect: Mood normal.        Behavior: Behavior normal.     ED Results / Procedures / Treatments   Labs (all labs ordered are listed, but only abnormal results are displayed) Labs Reviewed  RESP PANEL BY RT-PCR (RSV, FLU A&B, COVID)  [id number] - Abnormal; Notable for the following components:      Result Value   Influenza A by PCR POSITIVE (*)    All other components within normal limits  GROUP A STREP BY PCR    EKG None  Radiology No results found.  Procedures Procedures    Medications Ordered in ED Medications  acetaminophen (TYLENOL) tablet 650 mg (650 mg Oral Given 02/09/22 2036)    ED Course/ Medical Decision Making/ A&P                             Medical Decision Making Patient with complaint of fever and body aches since yesterday   Amount and/or Complexity of Data Reviewed Independent Historian: spouse    Details: See above  External Data Reviewed: notes.    Details: Previous notes  Labs: ordered.    Details: Influenza A   Risk OTC drugs. Risk Details: Well appearing, normal vital signs.  Stable for discharge with close follow up.      Final Clinical Impression(s) / ED Diagnoses Final diagnoses:  Influenza A  Return for intractable cough, coughing up blood, fevers > 100.4 unrelieved by medication, shortness of breath, intractable vomiting, chest pain, shortness  of breath, weakness, numbness, changes in speech, facial asymmetry, abdominal pain, passing out, Inability to tolerate liquids or food, cough, altered mental status or any concerns. No signs of systemic illness or infection. The patient is nontoxic-appearing on exam and vital signs are within normal limits.  I have reviewed the triage vital signs and the nursing notes. Pertinent labs & imaging results that were available during my care of the patient were reviewed by me and considered in my medical decision making (see chart for details). After history, exam, and medical workup I feel the patient has been appropriately medically screened and is safe for discharge home. Pertinent diagnoses were discussed with the patient. Patient was given return precautions.  Rx / DC Orders ED Discharge Orders     None         Zamoria Boss, MD 02/10/22 5638
